# Patient Record
Sex: Female | Born: 1970 | Race: Black or African American | Hispanic: No | Marital: Married | Smoking: Never smoker
Health system: Southern US, Community
[De-identification: ages and names within clinical notes are randomized; demographics above are authoritative.]

## PROBLEM LIST (undated history)

## (undated) ENCOUNTER — Inpatient Hospital Stay (HOSPITAL_COMMUNITY): Payer: Self-pay

## (undated) DIAGNOSIS — J0100 Acute maxillary sinusitis, unspecified: Principal | ICD-10-CM

## (undated) DIAGNOSIS — N970 Female infertility associated with anovulation: Secondary | ICD-10-CM

## (undated) DIAGNOSIS — R221 Localized swelling, mass and lump, neck: Secondary | ICD-10-CM

## (undated) DIAGNOSIS — M549 Dorsalgia, unspecified: Secondary | ICD-10-CM

## (undated) DIAGNOSIS — O09529 Supervision of elderly multigravida, unspecified trimester: Secondary | ICD-10-CM

## (undated) DIAGNOSIS — E611 Iron deficiency: Secondary | ICD-10-CM

## (undated) DIAGNOSIS — E663 Overweight: Secondary | ICD-10-CM

## (undated) DIAGNOSIS — E785 Hyperlipidemia, unspecified: Secondary | ICD-10-CM

## (undated) DIAGNOSIS — O0900 Supervision of pregnancy with history of infertility, unspecified trimester: Secondary | ICD-10-CM

## (undated) DIAGNOSIS — R7303 Prediabetes: Secondary | ICD-10-CM

## (undated) DIAGNOSIS — B029 Zoster without complications: Secondary | ICD-10-CM

## (undated) DIAGNOSIS — R220 Localized swelling, mass and lump, head: Secondary | ICD-10-CM

## (undated) DIAGNOSIS — R42 Dizziness and giddiness: Secondary | ICD-10-CM

## (undated) DIAGNOSIS — R61 Generalized hyperhidrosis: Secondary | ICD-10-CM

## (undated) DIAGNOSIS — M419 Scoliosis, unspecified: Secondary | ICD-10-CM

## (undated) DIAGNOSIS — D649 Anemia, unspecified: Secondary | ICD-10-CM

## (undated) DIAGNOSIS — B019 Varicella without complication: Secondary | ICD-10-CM

## (undated) DIAGNOSIS — R0602 Shortness of breath: Secondary | ICD-10-CM

## (undated) HISTORY — DX: Supervision of pregnancy with history of infertility, unspecified trimester: O09.00

## (undated) HISTORY — DX: Localized swelling, mass and lump, neck: R22.1

## (undated) HISTORY — DX: Dizziness and giddiness: R42

## (undated) HISTORY — DX: Female infertility associated with anovulation: N97.0

## (undated) HISTORY — DX: Generalized hyperhidrosis: R61

## (undated) HISTORY — DX: Scoliosis, unspecified: M41.9

## (undated) HISTORY — DX: Anemia, unspecified: D64.9

## (undated) HISTORY — DX: Acute maxillary sinusitis, unspecified: J01.00

## (undated) HISTORY — DX: Iron deficiency: E61.1

## (undated) HISTORY — DX: Dorsalgia, unspecified: M54.9

## (undated) HISTORY — DX: Zoster without complications: B02.9

## (undated) HISTORY — DX: Localized swelling, mass and lump, head: R22.0

## (undated) HISTORY — DX: Supervision of elderly multigravida, unspecified trimester: O09.529

## (undated) HISTORY — PX: GANGLION CYST EXCISION: SHX1691

## (undated) HISTORY — DX: Overweight: E66.3

## (undated) HISTORY — DX: Hyperlipidemia, unspecified: E78.5

## (undated) HISTORY — DX: Varicella without complication: B01.9

## (undated) HISTORY — DX: Prediabetes: R73.03

## (undated) HISTORY — DX: Shortness of breath: R06.02

---

## 1995-01-17 HISTORY — PX: WRIST SURGERY: SHX841

## 2000-01-17 DIAGNOSIS — B029 Zoster without complications: Secondary | ICD-10-CM

## 2000-01-17 HISTORY — DX: Zoster without complications: B02.9

## 2003-07-10 ENCOUNTER — Emergency Department (HOSPITAL_COMMUNITY): Admission: EM | Admit: 2003-07-10 | Discharge: 2003-07-10 | Payer: Self-pay | Admitting: Emergency Medicine

## 2004-08-30 ENCOUNTER — Ambulatory Visit: Payer: Self-pay | Admitting: Internal Medicine

## 2004-09-20 ENCOUNTER — Ambulatory Visit: Payer: Self-pay | Admitting: Internal Medicine

## 2007-05-29 ENCOUNTER — Ambulatory Visit: Payer: Self-pay | Admitting: Internal Medicine

## 2007-05-29 DIAGNOSIS — R221 Localized swelling, mass and lump, neck: Secondary | ICD-10-CM

## 2007-05-29 DIAGNOSIS — R220 Localized swelling, mass and lump, head: Secondary | ICD-10-CM | POA: Insufficient documentation

## 2007-05-29 HISTORY — DX: Localized swelling, mass and lump, head: R22.0

## 2007-05-29 LAB — CONVERTED CEMR LAB
CO2: 29 meq/L (ref 19–32)
GFR calc Af Amer: 106 mL/min
Glucose, Bld: 96 mg/dL (ref 70–99)
Glucose, Urine, Semiquant: NEGATIVE
Lymphocytes Relative: 42 % (ref 12.0–46.0)
Monocytes Absolute: 0.3 10*3/uL (ref 0.1–1.0)
Monocytes Relative: 9.8 % (ref 3.0–12.0)
Neutrophils Relative %: 46.2 % (ref 43.0–77.0)
Platelets: 394 10*3/uL (ref 150–400)
Potassium: 4 meq/L (ref 3.5–5.1)
RDW: 12 % (ref 11.5–14.6)
Sodium: 141 meq/L (ref 135–145)
Specific Gravity, Urine: 1.02
T3 Uptake Ratio: 43 % — ABNORMAL HIGH (ref 22.5–37.0)
TSH: 0.64 microintl units/mL (ref 0.35–5.50)
WBC Urine, dipstick: NEGATIVE
pH: 8

## 2007-05-30 ENCOUNTER — Ambulatory Visit: Payer: Self-pay | Admitting: Cardiology

## 2007-06-07 ENCOUNTER — Ambulatory Visit: Payer: Self-pay | Admitting: Internal Medicine

## 2008-02-03 ENCOUNTER — Telehealth: Payer: Self-pay | Admitting: Internal Medicine

## 2008-02-03 ENCOUNTER — Ambulatory Visit: Payer: Self-pay | Admitting: Internal Medicine

## 2008-02-03 DIAGNOSIS — J0100 Acute maxillary sinusitis, unspecified: Secondary | ICD-10-CM | POA: Insufficient documentation

## 2008-02-03 HISTORY — DX: Acute maxillary sinusitis, unspecified: J01.00

## 2009-04-12 ENCOUNTER — Ambulatory Visit: Payer: Self-pay | Admitting: Family Medicine

## 2009-04-12 DIAGNOSIS — M546 Pain in thoracic spine: Secondary | ICD-10-CM | POA: Insufficient documentation

## 2009-04-12 DIAGNOSIS — M549 Dorsalgia, unspecified: Secondary | ICD-10-CM

## 2009-04-12 HISTORY — DX: Dorsalgia, unspecified: M54.9

## 2010-02-06 ENCOUNTER — Encounter: Payer: Self-pay | Admitting: Internal Medicine
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
## 2010-03-07 ENCOUNTER — Telehealth: Payer: Self-pay | Admitting: Internal Medicine

## 2010-03-07 ENCOUNTER — Ambulatory Visit (INDEPENDENT_AMBULATORY_CARE_PROVIDER_SITE_OTHER): Payer: BC Managed Care – PPO | Admitting: Internal Medicine

## 2010-03-07 ENCOUNTER — Encounter: Payer: Self-pay | Admitting: Internal Medicine

## 2010-03-07 VITALS — BP 120/80 | HR 76 | Temp 99.2°F | Resp 14 | Ht 69.0 in | Wt 188.0 lb

## 2010-03-07 DIAGNOSIS — R519 Headache, unspecified: Secondary | ICD-10-CM

## 2010-03-07 DIAGNOSIS — J0100 Acute maxillary sinusitis, unspecified: Secondary | ICD-10-CM

## 2010-03-07 DIAGNOSIS — R51 Headache: Secondary | ICD-10-CM

## 2010-03-07 MED ORDER — BROMPHENIRAMINE-PSEUDOEPH 4-60 MG PO TABS: 1.0000 | ORAL_TABLET | Freq: Four times a day (QID) | ORAL | Status: AC | PRN

## 2010-03-07 MED ORDER — LEVOFLOXACIN 500 MG PO TABS: 500.0000 mg | ORAL_TABLET | Freq: Every day | ORAL | Status: AC

## 2010-03-07 NOTE — Progress Notes (Signed)
Subjective:    Patient ID: Raven Bauer, female    DOB: [birth year], 40 y.o.   MRN: [id number]  HPI   patient is a 40 year old African American female who presents for an acute complaint of a headache with sinus congestion sore throat and loss of her voice.  She states that she has been sick for over a week but for the past 48 hours she has lost her voice she denies any sore throat but complains of a low-grade fever chills chronic hacking cough scratchy throat and some difficulty swallowing. She has no history of any additional risk factors or exposures she does seem to get sinus infections in the winter which may correspond to her known history of allergic rhinitis.    Review of Systems  Constitutional: Negative for activity change, appetite change and fatigue.  HENT: Positive for congestion, rhinorrhea, trouble swallowing, voice change, postnasal drip and sinus pressure. Negative for ear pain and neck pain.   Eyes: Negative for redness and visual disturbance.  Respiratory: Positive for cough and chest tightness. Negative for wheezing.   Gastrointestinal: Negative for abdominal pain and abdominal distention.  Genitourinary: Negative for dysuria, frequency and menstrual problem.  Musculoskeletal: Negative for myalgias, joint swelling and arthralgias.  Skin: Negative for rash and wound.  Neurological: Negative for dizziness, weakness and headaches.  Hematological: Negative for adenopathy. Does not bruise/bleed easily.  Psychiatric/Behavioral: Negative for sleep disturbance and decreased concentration.   Past Medical History  Diagnosis Date  . Acute maxillary sinusitis 02/03/2008  . BACK PAIN, UPPER 04/12/2009  . SWELLING MASS OR LUMP IN HEAD AND NECK 05/29/2007   History reviewed. No pertinent past surgical history.  reports that she has never smoked. She does not have any smokeless tobacco history on file. She reports that she does not drink alcohol or use illicit drugs. family  history includes Heart disease in her father; Hyperlipidemia in her father; and Hypertension in her father.        Objective:   Physical Exam  Constitutional: She is oriented to person, place, and time. She appears well-developed and well-nourished. She appears distressed.  HENT:  Head: Normocephalic and atraumatic.  Right Ear: External ear normal.  Left Ear: External ear normal.  Mouth/Throat: Oropharyngeal exudate present.        Turbinates are inflamed bilaterally greater on the right than the left  Eyes: Conjunctivae and EOM are normal. Pupils are equal, round, and reactive to light.  Neck: Normal range of motion. Neck supple. No JVD present. No tracheal deviation present. No thyromegaly present.  Cardiovascular: Normal rate, regular rhythm, normal heart sounds and intact distal pulses.   No murmur heard. Pulmonary/Chest: Effort normal and breath sounds normal. She has no wheezes. She exhibits no tenderness.  Abdominal: Soft. Bowel sounds are normal.  Musculoskeletal: Normal range of motion. She exhibits no edema and no tenderness.  Lymphadenopathy:    She has no cervical adenopathy.  Neurological: She is alert and oriented to person, place, and time. She has normal reflexes. No cranial nerve deficit.  Skin: Skin is warm and dry. She is not diaphoretic.  Psychiatric: She has a normal mood and affect. Her behavior is normal.          Assessment & Plan:   the patient  4 acute frontal sinusitis with headache in the past her sinusitis has been difficult to treat with complications she states that her symptoms are moderately severe and she is in moderate distress.   We will begin by treating  her with an antibiotic Levaquin 500 mg by mouth daily for 10 days should her symptoms not resolve a CT scan should be ordered of her sinuses to evaluate she'll be given Bromfed 4 times a day for congestion and she can continue the Delsym for her cough consideration for referral to an ear nose  and throat doctor should her symptoms persist as well as the aforementioned CT scan of the sinuses. And consideration can be given for allergy testing because of  the recurrent seasonal nature of these infections

## 2010-03-07 NOTE — Telephone Encounter (Signed)
Left message on machine for pt 

## 2010-03-07 NOTE — Telephone Encounter (Signed)
Would obtain  Mucinex fast max cold and sinus and use it as directed

## 2010-03-07 NOTE — Telephone Encounter (Signed)
Per phar brofed is not longer available. Please advise

## 2010-04-25 DIAGNOSIS — B019 Varicella without complication: Secondary | ICD-10-CM

## 2010-04-25 DIAGNOSIS — M419 Scoliosis, unspecified: Secondary | ICD-10-CM

## 2010-04-25 DIAGNOSIS — E611 Iron deficiency: Secondary | ICD-10-CM

## 2010-04-25 DIAGNOSIS — R61 Generalized hyperhidrosis: Secondary | ICD-10-CM

## 2010-04-25 HISTORY — DX: Generalized hyperhidrosis: R61

## 2010-04-25 HISTORY — PX: WISDOM TOOTH EXTRACTION: SHX21

## 2010-04-25 HISTORY — DX: Scoliosis, unspecified: M41.9

## 2010-04-25 HISTORY — DX: Iron deficiency: E61.1

## 2010-04-25 HISTORY — DX: Varicella without complication: B01.9

## 2011-03-28 DIAGNOSIS — R61 Generalized hyperhidrosis: Secondary | ICD-10-CM | POA: Insufficient documentation

## 2011-04-26 ENCOUNTER — Ambulatory Visit (INDEPENDENT_AMBULATORY_CARE_PROVIDER_SITE_OTHER): Payer: BC Managed Care – PPO | Admitting: Obstetrics and Gynecology

## 2011-04-26 ENCOUNTER — Encounter: Payer: Self-pay | Admitting: Obstetrics and Gynecology

## 2011-04-26 VITALS — BP 108/68 | HR 84 | Temp 98.3°F | Resp 16 | Ht 69.0 in | Wt 188.0 lb

## 2011-04-26 DIAGNOSIS — Z124 Encounter for screening for malignant neoplasm of cervix: Secondary | ICD-10-CM

## 2011-04-26 DIAGNOSIS — N979 Female infertility, unspecified: Secondary | ICD-10-CM

## 2011-04-26 NOTE — Progress Notes (Signed)
Subjective:    Raven Bauer is a 41 y.o. female, No obstetric history on file., who presents for an annual exam. The patient reports:normal menses, no abnormal bleeding, pelvic pain or discharge, no complaints    History  Sexual Activity  . Sexually Active: Yes  . .no method, trying to conceive.The patient reports AALCHX@ and  History  Smoking status  . Never Smoker   Smokeless tobacco  . Not on file  .  Last mammogram: not applicable  Last pap: was normal and not applicable  2012  GC/Chlamydia cultures offered: requested HIV/RPR/HbsAg offered:  declined HSV 1 and 2 glycoprotein offered: declined  Menstrual cycle:   LMP: Patient's last menstrual period was 04/05/2011.           Cycle is monthly with normal flow and without intermenstrual bleeding or severe dysmenorrhea   The following portions of the patient's history were reviewed and updated as appropriate: allergies, current medications, past medical history and problem list.  Review of Systems Pertinent items are noted in HPI. Breast:Negative for breast lump,nipple discharge or nipple retraction Gastrointestinal: negative, Negative for abdominal pain, change in bowel habits or rectal bleeding Urinary:negative      Objective:    BP 108/68  Pulse 84  Temp 98.3 F (36.8 C)  Resp 16  Ht 5\' 9"  (1.753 m)  Wt 188 lb (85.276 kg)  BMI 27.76 kg/m2  LMP 04/05/2011 Weight:  Wt Readings from Last 1 Encounters:  04/26/11 188 lb (85.276 kg)   BMI: Body mass index is 27.76 kg/(m^2). General Appearance: Alert, appropriate appearance for age. No acute distress HEENT: Grossly normal Neck / Thyroid: Supple, no masses, nodes or enlargement Lungs: clear to auscultation bilaterally Back: No CVA tenderness Breast Exam: No masses or nodes.No dimpling, nipple retraction or discharge. Cardiovascular: Regular rate and rhythm. S1, S2, no murmur Gastrointestinal: Soft, non-tender, no masses or organomegaly Pelvic Exam: External  genitalia: normal general appearance Vagina with white discharge.  Cervix no lesions.  Uterus nl sized Adnexa no masses Rectovaginal: not indicated and normal rectal, no masses Lymphatic Exam: Non-palpable nodes in neck, clavicular, axillary, or inguinal regions Skin: no rash or abnormalities Neurologic: Normal gait and speech, no tremor  Psychiatric: Alert and oriented, appropriate affect.   Wet Prep:positive hyphae Urinalysis:not applicable UPT: Not done   Assessment:    Monilia  Trying to conceive   Plan:    pap smear with HPV return annually or prn STD screening: see above Contraception:no method trying to conceive      Antha Niday PMD

## 2011-04-28 ENCOUNTER — Telehealth: Payer: Self-pay

## 2011-04-28 LAB — PAP IG, CT-NG NAA, HPV HIGH-RISK: Chlamydia Probe Amp: NEGATIVE

## 2011-04-28 NOTE — Telephone Encounter (Signed)
PC TO PT PER TELEPHONE NOTE. TOLD PT PAP SMEAR RESULTS NOT BACK YET; HOWEVER GC/CT- BOTH NEG. PT TOLD PROGESTERONE=NON OVULATORY(8.4). WILL CONSULT WITH [facility] PER POC RGDG PROGESTERONE AND WILL CB WITH RECS. PT AGREES.

## 2011-05-01 ENCOUNTER — Other Ambulatory Visit: Payer: BC Managed Care – PPO

## 2011-05-01 DIAGNOSIS — N979 Female infertility, unspecified: Secondary | ICD-10-CM

## 2011-05-01 NOTE — Telephone Encounter (Signed)
TC TO PT PER [facility] RECS. PT NEEDS TSH AND PROLACTIN LABS. APPT SCHED TODAY WITH LAB ONLY. PT AGREES.

## 2011-05-02 LAB — PROLACTIN: Prolactin: 6.3 ng/mL

## 2011-05-04 ENCOUNTER — Telehealth: Payer: Self-pay | Admitting: Obstetrics and Gynecology

## 2011-05-05 ENCOUNTER — Telehealth: Payer: Self-pay | Admitting: Obstetrics and Gynecology

## 2011-05-05 NOTE — Telephone Encounter (Signed)
Routed to triage 

## 2011-05-05 NOTE — Telephone Encounter (Signed)
Lm on pt's vm to cb rgd msg. bt cma

## 2011-05-08 NOTE — Telephone Encounter (Signed)
Spoke with pt rgd test results. Told pt the results have not came in yet would call pt back with results. bt cma

## 2011-05-10 NOTE — Telephone Encounter (Signed)
PC TO PT PER [facility] RECS. PT TOLD PROLACTIN AND TSH-WNL. PT NEEDS FU TO DISCUSS FERTILITY MEDS.  APPT SCHED 05/11/11@4 :00P WITH [facility]. PT AGREES.

## 2011-05-10 NOTE — Telephone Encounter (Signed)
PC TO PT PER TELEPHONE CALL. APOLOGIZED TO PT FOR LACK OF FU RGDG RESULTS. PT VOICES UNDERSTANDING. TOLD PT PAP SMEAR-WNL. HPV,GC/CT-ALL NEG. PROGESTERONE-8.4=NON OVULATORY. TOLD PT WILL FU WITH [facility] TODAY RGDG PROLACTIN AND TSH RESULTS ALSO. WILL CB WITH RECS TODAY. PT AGREES.

## 2011-05-10 NOTE — Telephone Encounter (Signed)
Raven Bauer has chart/chandra addressing issue

## 2011-05-11 ENCOUNTER — Telehealth: Payer: Self-pay

## 2011-05-11 ENCOUNTER — Ambulatory Visit (INDEPENDENT_AMBULATORY_CARE_PROVIDER_SITE_OTHER): Payer: BC Managed Care – PPO | Admitting: Obstetrics and Gynecology

## 2011-05-11 ENCOUNTER — Encounter: Payer: Self-pay | Admitting: Obstetrics and Gynecology

## 2011-05-11 VITALS — BP 122/78 | Temp 98.3°F | Ht 69.0 in

## 2011-05-11 DIAGNOSIS — N970 Female infertility associated with anovulation: Secondary | ICD-10-CM

## 2011-05-11 MED ORDER — INOSITOL-FOLIC ACID 2000-200 MG-MCG PO PACK: 1.0000 | PACK | Freq: Every day | ORAL | Status: DC

## 2011-05-11 MED ORDER — CLOMIPHENE CITRATE 50 MG PO TABS: 50.0000 mg | ORAL_TABLET | Freq: Every day | ORAL | Status: AC

## 2011-05-11 MED ORDER — PRENATAL VIT W/ FE BISG-FA 25-1 MG PO TABS: 1.0000 | ORAL_TABLET | Freq: Every day | ORAL | Status: DC

## 2011-05-11 NOTE — Telephone Encounter (Signed)
PT CALL HAS ALREADY BEEN TAKEN CARE OF BY CHANDRA

## 2011-05-11 NOTE — Patient Instructions (Signed)
Patient Education Materials to be provided at check out (*indicates is located in Garment/textile technologist):  Clomid

## 2011-05-11 NOTE — Progress Notes (Signed)
Subjective:    Raven Bauer is a 41 y.o. female No obstetric history on file. who presents with a diagnosis of  secondary infertility with evidence of anovulation for result review and followup.  Current medication: none  Objective:   Infertility laboratory results have been updated and/or reviewed:  and are remarkable for:day #21 progesterone of 8.4, nl TSH and prolactin, neg GC and CHL    BP 122/78  Temp 98.3 F (36.8 C)  Ht 5\' 9"  (1.753 m)  LMP 04/29/2011  Wt Readings from Last 1 Encounters:  04/26/11 188 lb (85.276 kg)    BMI: There is no weight on file to calculate BMI.  Pelvic exam:   Normal external genitalia    Normal uterus    Normal adnexa   Assessment:   1) Secondary infertility  2) Oligoovulation 3) Partner who has fathered no pregnancies  Plan:   1) Clomid 50 mg 2) Day 21 Progesterone 3)Timed intercourse 4) CSA for husband 5) Follow-up:  Day 28-32 6) PNV 7) Pregnitude   Jo Booze PMD 4/25/20137:03 PM

## 2011-06-20 ENCOUNTER — Encounter: Payer: BC Managed Care – PPO | Admitting: Obstetrics and Gynecology

## 2011-09-28 ENCOUNTER — Telehealth: Payer: Self-pay

## 2011-09-28 NOTE — Telephone Encounter (Signed)
Tc to pt per e-mail received rgdg medication list recieved from last OV. Inositol listed on sheet;however pt did not receive rx for it. Informed pt this is an OTC medication. Pt voices understanding. Pt states,"will start Clomid in October". Re informed pt of instructions to take Clomid 50mg  day 3-7 of cycle, timed IC every other day b/t day 11-17,  day 21 progesterone, and follow up with Dr. Pennie Rushing day 28-32 of cycle. Pt to call 1st day of menses to sched accordingly. Pt voices understanding.

## 2011-10-18 ENCOUNTER — Telehealth: Payer: Self-pay | Admitting: Obstetrics and Gynecology

## 2011-10-18 NOTE — Telephone Encounter (Signed)
msg for you

## 2011-10-18 NOTE — Telephone Encounter (Signed)
Tc to pt per telephone call. Appt sched 11/07/11 @ 4:00p with lab only for day 21 progesterone and fu appt sched 11/13/11 @ 4:30p with [facility]. Pt voices understanding.

## 2011-11-07 ENCOUNTER — Other Ambulatory Visit: Payer: BC Managed Care – PPO

## 2011-11-07 DIAGNOSIS — N970 Female infertility associated with anovulation: Secondary | ICD-10-CM

## 2011-11-13 ENCOUNTER — Encounter: Payer: Self-pay | Admitting: Obstetrics and Gynecology

## 2011-11-13 ENCOUNTER — Ambulatory Visit (INDEPENDENT_AMBULATORY_CARE_PROVIDER_SITE_OTHER): Payer: BC Managed Care – PPO | Admitting: Obstetrics and Gynecology

## 2011-11-13 VITALS — BP 110/64 | Ht 69.0 in | Wt 191.0 lb

## 2011-11-13 DIAGNOSIS — N898 Other specified noninflammatory disorders of vagina: Secondary | ICD-10-CM

## 2011-11-13 DIAGNOSIS — N970 Female infertility associated with anovulation: Secondary | ICD-10-CM

## 2011-11-13 LAB — POCT URINE PREGNANCY: Preg Test, Ur: NEGATIVE

## 2011-11-13 MED ORDER — CLOMIPHENE CITRATE 50 MG PO TABS: 50.0000 mg | ORAL_TABLET | Freq: Every day | ORAL | Status: DC

## 2011-11-13 NOTE — Progress Notes (Signed)
INFERTILITY FOLLOW UP:   Subjective:    Raven Bauer is a 41 y.o. female [id number] who presents with a diagnosis of  secondary infertility for ovary check.  Last Pap: 04/26/2011 WNL Trying to Conceive: yes G,P: 3,1 TSH: 0.868 05/01/2011 FSH/LH: n/a PRL: 6.3 05/01/2011 DAY 21 PROG: 23.0 11/07/2011 HSG: n/a Semen Analysis: n/a LSC: n/a FBS/INSULIN: n/a GC/CHLAMYDIA: Neg 04/26/2011   Current medication:Clomid 50 mg daily days 3 through 7  Objective:   Infertility laboratory results have been updated and/or reviewed:  yes    BP 110/64  Ht 5\' 9"  (1.753 m)  Wt 191 lb (86.637 kg)  BMI 28.21 kg/m2  LMP 10/18/2011  Wt Readings from Last 1 Encounters:  11/13/11 191 lb (86.637 kg)    BMI: Body mass index is 28.21 kg/(m^2).  Pelvic exam:   Normal external genitalia    Normal uterus    Normal adnexa   Assessment:  Secondary infertility Ovulation on Clomid  Plan:   Continue Clomid 50 mg Follow-up:  After 3 cycles of Clomid.  The patient may choose to restart Clomid in January after the holiday.   Dierdre Forth MD

## 2011-11-15 LAB — GC/CHLAMYDIA PROBE AMP
CT Probe RNA: NEGATIVE
GC Probe RNA: NEGATIVE

## 2012-01-17 NOTE — L&D Delivery Note (Signed)
Delivery Note At 6:27 AM a viable female was delivered via Vaginal, Spontaneous Delivery (Presentation:LOA ).  APGAR: 9, 9; weight pending.   Placenta status: Intact, Spontaneous.  Cord:  with the following complications: none.  Cord pH: not collected  Anesthesia: Epidural  Episiotomy:  Lacerations: 1st degree vaginal Suture Repair: 3.0 vicryl Est. Blood Loss (mL): 300 mL  Mom to postpartum.  Baby to skin to skin immediately after delivery.  Admit to AICU for 24 hours of Mag Sulfate Planned BTL Breastfeeding Inpatient circ   Haroldine Laws 10/12/2012, 7:06 AM

## 2012-02-22 ENCOUNTER — Other Ambulatory Visit: Payer: BC Managed Care – PPO

## 2012-02-22 DIAGNOSIS — N979 Female infertility, unspecified: Secondary | ICD-10-CM

## 2012-02-23 LAB — PROGESTERONE: Progesterone: 13.2 ng/mL

## 2012-03-02 ENCOUNTER — Other Ambulatory Visit: Payer: Self-pay

## 2012-03-04 ENCOUNTER — Other Ambulatory Visit: Payer: Self-pay

## 2012-03-04 ENCOUNTER — Other Ambulatory Visit: Payer: BC Managed Care – PPO

## 2012-03-04 ENCOUNTER — Encounter: Payer: BC Managed Care – PPO | Admitting: Obstetrics and Gynecology

## 2012-03-04 DIAGNOSIS — N979 Female infertility, unspecified: Secondary | ICD-10-CM

## 2012-03-04 LAB — HCG, QUANTITATIVE, PREGNANCY: hCG, Beta Chain, Quant, S: 539.4 m[IU]/mL

## 2012-03-06 ENCOUNTER — Telehealth: Payer: Self-pay | Admitting: Obstetrics and Gynecology

## 2012-03-06 ENCOUNTER — Other Ambulatory Visit: Payer: BC Managed Care – PPO

## 2012-03-06 DIAGNOSIS — N979 Female infertility, unspecified: Secondary | ICD-10-CM

## 2012-03-06 LAB — HCG, QUANTITATIVE, PREGNANCY: hCG, Beta Chain, Quant, S: 1407 m[IU]/mL

## 2012-03-06 NOTE — Telephone Encounter (Signed)
[facility] pt 

## 2012-03-15 ENCOUNTER — Ambulatory Visit: Payer: BC Managed Care – PPO | Admitting: Obstetrics and Gynecology

## 2012-03-15 DIAGNOSIS — Z331 Pregnant state, incidental: Secondary | ICD-10-CM

## 2012-03-18 LAB — PRENATAL PANEL VII
Antibody Screen: NEGATIVE
Basophils Absolute: 0 10*3/uL (ref 0.0–0.1)
Basophils Relative: 0 % (ref 0–1)
Eosinophils Relative: 1 % (ref 0–5)
HCT: 34.8 % — ABNORMAL LOW (ref 36.0–46.0)
HIV: NONREACTIVE
MCHC: 33.6 g/dL (ref 30.0–36.0)
Monocytes Absolute: 0.6 10*3/uL (ref 0.1–1.0)
Neutro Abs: 3.5 10*3/uL (ref 1.7–7.7)
Platelets: 412 10*3/uL — ABNORMAL HIGH (ref 150–400)
RDW: 13.2 % (ref 11.5–15.5)
Rh Type: POSITIVE
WBC: 6.2 10*3/uL (ref 4.0–10.5)

## 2012-03-19 LAB — CULTURE, OB URINE: Colony Count: 70000

## 2012-03-19 LAB — HEMOGLOBINOPATHY EVALUATION
Hgb A2 Quant: 2.6 % (ref 2.2–3.2)
Hgb F Quant: 0 % (ref 0.0–2.0)
Hgb S Quant: 0 %

## 2012-03-20 LAB — POCT URINALYSIS DIPSTICK
Blood, UA: NEGATIVE
Glucose, UA: NEGATIVE
Nitrite, UA: NEGATIVE
Protein, UA: NEGATIVE
Urobilinogen, UA: NEGATIVE
pH, UA: 6

## 2012-03-20 NOTE — Progress Notes (Unsigned)
NOB interview completed.  PNV samples given.  NOB work up scheduled for Monday 04/01/12 @ 0900 w/ [facility].

## 2012-04-01 ENCOUNTER — Ambulatory Visit: Payer: BC Managed Care – PPO

## 2012-04-01 VITALS — BP 110/64 | Wt 196.0 lb

## 2012-04-01 DIAGNOSIS — Z8759 Personal history of other complications of pregnancy, childbirth and the puerperium: Secondary | ICD-10-CM | POA: Insufficient documentation

## 2012-04-01 DIAGNOSIS — O0901 Supervision of pregnancy with history of infertility, first trimester: Secondary | ICD-10-CM

## 2012-04-01 DIAGNOSIS — IMO0002 Reserved for concepts with insufficient information to code with codable children: Secondary | ICD-10-CM

## 2012-04-01 DIAGNOSIS — O0900 Supervision of pregnancy with history of infertility, unspecified trimester: Secondary | ICD-10-CM | POA: Insufficient documentation

## 2012-04-01 DIAGNOSIS — O09529 Supervision of elderly multigravida, unspecified trimester: Secondary | ICD-10-CM | POA: Insufficient documentation

## 2012-04-01 DIAGNOSIS — E663 Overweight: Secondary | ICD-10-CM

## 2012-04-01 DIAGNOSIS — Z862 Personal history of diseases of the blood and blood-forming organs and certain disorders involving the immune mechanism: Secondary | ICD-10-CM | POA: Insufficient documentation

## 2012-04-01 HISTORY — DX: Supervision of pregnancy with history of infertility, unspecified trimester: O09.00

## 2012-04-01 HISTORY — DX: Overweight: E66.3

## 2012-04-01 HISTORY — DX: Supervision of elderly multigravida, unspecified trimester: O09.529

## 2012-04-01 LAB — US OB TRANSVAGINAL

## 2012-04-01 NOTE — Progress Notes (Signed)
[redacted]w[redacted]d Pt c/o indigestion and nausea Last pap 04/26/11 WNL GC/CT neg 03/15/12 via urine  Pt wants genetic screenings Pt declines flu vaccine  Ultrasound:  Amnion & yolk sac seen, S=D          Retroflexed uterus. WNL's          cx closed                      RTOV: Corpus luteum          LTOV: WNL's          Normal adnexa          Small amount of CDS fluid - WNL's

## 2012-04-01 NOTE — Progress Notes (Addendum)
  Subjective:    Raven Bauer is a 42y.o. MF, being seen today for her first obstetrical visit.  This is a planned pregnancy. She is at [redacted]w[redacted]d gestation. Her obstetrical history is significant for advanced maternal age and conceived on Clomid. Relationship with FOB: spouse, living together; pt has been remarried for 2 yrs. She has a 14y.o. Daughter from previous marriage.  Infertility for 1.5 years.  Tried clomid in the fall for one month, then took a break and retried in January, and conceived.  Pt works in Production manager support; husband works in Immunologist for a company that supplies [employer].  Patient does intend to breast feed. Pregnancy history fully reviewed.  Patient reports heartburn and nausea.  Review of Systems:   Review of Systems  Constitutional: Negative.   HENT: Negative.   Eyes: Negative.   Respiratory: Negative.   Cardiovascular: Negative.   Gastrointestinal: Positive for nausea.  Endocrine: Negative.   Genitourinary: Negative.   Musculoskeletal: Negative.   Neurological: Negative.     Objective:     BP 110/64  Wt 196 lb (88.905 kg)  BMI 28.93 kg/m2  LMP 02/02/2012 Physical Exam  Constitutional: She is oriented to person, place, and time. She appears well-developed and well-nourished. No distress.  HENT:  Head: Normocephalic and atraumatic.  Hair thin on top  Eyes: Pupils are equal, round, and reactive to light.  Neck:  Slightly enlarged thyroid  Cardiovascular: Normal rate and regular rhythm.   Respiratory: Effort normal and breath sounds normal.  GI: Soft.  No CVAT  Genitourinary:  Pelvic exam deferred  Musculoskeletal: She exhibits no edema.  Neurological: She is alert and oriented to person, place, and time.  Skin: Skin is warm and dry.  Psychiatric: She has a normal mood and affect. Her behavior is normal. Judgment and thought content normal.    Maternal Exam:  Introitus: not evaluated.   Cervix: not evaluated.   Fetal  Exam Fetal Monitor Review: Mode: ultrasound.   Baseline rate: 175.         Assessment:    Pregnancy: [id number] AMA Clomid pregnancy     Plan:  [redacted]w[redacted]d SIUP w/ size=dates on u/s today. Rev'd comfort measures for nausea; declines Rx at this time. Pap due in April, but pt may defer til July during birth month since no h/o abnl.     Initial labs drawn, WNL and rev'd. Prenatal vitamins. Problem list reviewed and updated.  Pt plans Harmony or [id number]; will check w/ her insurance to see which one is covered. Role of ultrasound in pregnancy discussed; fetal survey: u/s done today secondary to clomid pregnancy and twins run in her family; plan anatomy for next scan.. Amniocentesis discussed: declined. Follow up in 4 weeks, or prn. Marland Kitchen    Antonietta Breach 04/01/2012

## 2012-04-02 ENCOUNTER — Encounter: Payer: Self-pay | Admitting: Obstetrics and Gynecology

## 2012-04-11 ENCOUNTER — Encounter: Payer: Self-pay | Admitting: Obstetrics and Gynecology

## 2012-04-19 ENCOUNTER — Encounter: Payer: Self-pay | Admitting: Obstetrics and Gynecology

## 2012-09-06 ENCOUNTER — Inpatient Hospital Stay (HOSPITAL_COMMUNITY)
Admission: AD | Admit: 2012-09-06 | Discharge: 2012-09-06 | Disposition: A | Discharge status: Home / Self Care | Payer: BC Managed Care – PPO | Source: Ambulatory Visit | Attending: Obstetrics and Gynecology | Admitting: Obstetrics and Gynecology

## 2012-09-06 ENCOUNTER — Encounter (HOSPITAL_COMMUNITY): Payer: Self-pay | Admitting: *Deleted

## 2012-09-06 DIAGNOSIS — O09523 Supervision of elderly multigravida, third trimester: Secondary | ICD-10-CM

## 2012-09-06 DIAGNOSIS — O99891 Other specified diseases and conditions complicating pregnancy: Secondary | ICD-10-CM | POA: Insufficient documentation

## 2012-09-06 DIAGNOSIS — IMO0002 Reserved for concepts with insufficient information to code with codable children: Secondary | ICD-10-CM | POA: Insufficient documentation

## 2012-09-06 DIAGNOSIS — O0900 Supervision of pregnancy with history of infertility, unspecified trimester: Secondary | ICD-10-CM

## 2012-09-06 DIAGNOSIS — R51 Headache: Secondary | ICD-10-CM | POA: Insufficient documentation

## 2012-09-06 LAB — CBC
HCT: 32.1 % — ABNORMAL LOW (ref 36.0–46.0)
Hemoglobin: 11.3 g/dL — ABNORMAL LOW (ref 12.0–15.0)
MCH: 32.7 pg (ref 26.0–34.0)
MCHC: 35.2 g/dL (ref 30.0–36.0)
RDW: 13.2 % (ref 11.5–15.5)

## 2012-09-06 LAB — COMPREHENSIVE METABOLIC PANEL
Albumin: 2.8 g/dL — ABNORMAL LOW (ref 3.5–5.2)
BUN: 5 mg/dL — ABNORMAL LOW (ref 6–23)
Calcium: 8.8 mg/dL (ref 8.4–10.5)
GFR calc Af Amer: >90 mL/min (ref >90)
Glucose, Bld: 81 mg/dL (ref 70–99)
Potassium: 4 mEq/L (ref 3.5–5.1)
Sodium: 132 mEq/L — ABNORMAL LOW (ref 135–145)
Total Protein: 6.2 g/dL (ref 6.0–8.3)

## 2012-09-06 LAB — URIC ACID: Uric Acid, Serum: 4.3 mg/dL (ref 2.4–7.0)

## 2012-09-06 LAB — URINALYSIS, ROUTINE W REFLEX MICROSCOPIC
Ketones, ur: NEGATIVE mg/dL
Leukocytes, UA: NEGATIVE
Protein, ur: NEGATIVE mg/dL
Urobilinogen, UA: 0.2 mg/dL (ref 0.0–1.0)

## 2012-09-06 LAB — PROTEIN / CREATININE RATIO, URINE
Creatinine, Urine: 49.81 mg/dL
Protein Creatinine Ratio: 0.12 (ref 0.00–0.15)
Total Protein, Urine: 5.8 mg/dL

## 2012-09-06 LAB — LACTATE DEHYDROGENASE: LDH: 228 U/L (ref 94–250)

## 2012-09-06 NOTE — MAU Note (Signed)
Pt reports she has had lower extremity swelling. Reports increased pain in the right with swelling. Pt has tried elevating extremitries. Pt reports intermittent HA. No HA at this time.
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
## 2012-10-11 ENCOUNTER — Inpatient Hospital Stay (HOSPITAL_COMMUNITY)
Admission: AD | Admit: 2012-10-11 | Discharge: 2012-10-14 | DRG: 652 | Disposition: A | Discharge status: Home / Self Care | Payer: BC Managed Care – PPO | Source: Ambulatory Visit | Attending: Obstetrics and Gynecology | Admitting: Obstetrics and Gynecology

## 2012-10-11 ENCOUNTER — Encounter (HOSPITAL_COMMUNITY): Payer: Self-pay | Admitting: *Deleted

## 2012-10-11 DIAGNOSIS — R748 Abnormal levels of other serum enzymes: Secondary | ICD-10-CM | POA: Diagnosis present

## 2012-10-11 DIAGNOSIS — O09529 Supervision of elderly multigravida, unspecified trimester: Secondary | ICD-10-CM | POA: Diagnosis present

## 2012-10-11 DIAGNOSIS — O1424 HELLP syndrome, complicating childbirth: Secondary | ICD-10-CM | POA: Diagnosis present

## 2012-10-11 DIAGNOSIS — D62 Acute posthemorrhagic anemia: Secondary | ICD-10-CM | POA: Diagnosis not present

## 2012-10-11 DIAGNOSIS — IMO0002 Reserved for concepts with insufficient information to code with codable children: Principal | ICD-10-CM | POA: Diagnosis present

## 2012-10-11 DIAGNOSIS — D689 Coagulation defect, unspecified: Secondary | ICD-10-CM | POA: Diagnosis present

## 2012-10-11 DIAGNOSIS — E669 Obesity, unspecified: Secondary | ICD-10-CM | POA: Diagnosis present

## 2012-10-11 DIAGNOSIS — D696 Thrombocytopenia, unspecified: Secondary | ICD-10-CM | POA: Diagnosis present

## 2012-10-11 DIAGNOSIS — Z302 Encounter for sterilization: Secondary | ICD-10-CM

## 2012-10-11 LAB — OB RESULTS CONSOLE GBS: GBS: NEGATIVE

## 2012-10-11 LAB — GROUP B STREP BY PCR: Group B strep by PCR: NEGATIVE

## 2012-10-11 LAB — TYPE AND SCREEN

## 2012-10-11 LAB — CBC
Hemoglobin: 12.6 g/dL (ref 12.0–15.0)
RBC: 3.91 MIL/uL (ref 3.87–5.11)
WBC: 6.9 10*3/uL (ref 4.0–10.5)

## 2012-10-11 MED ORDER — ACETAMINOPHEN 325 MG PO TABS: 650.0000 mg | ORAL_TABLET | ORAL | Status: DC | PRN

## 2012-10-11 MED ORDER — ONDANSETRON HCL 4 MG/2ML IJ SOLN
4.0000 mg | Freq: Four times a day (QID) | INTRAMUSCULAR | Status: DC | PRN
  Administered 2012-10-11 – 2012-10-12 (×2): 4 mg via INTRAVENOUS
  Filled 2012-10-11 (×2): qty 2

## 2012-10-11 MED ORDER — LIDOCAINE HCL (PF) 1 % IJ SOLN
30.0000 mL | INTRAMUSCULAR | Status: DC | PRN
  Filled 2012-10-11: qty 30

## 2012-10-11 MED ORDER — OXYCODONE-ACETAMINOPHEN 5-325 MG PO TABS: 1.0000 | ORAL_TABLET | ORAL | Status: DC | PRN

## 2012-10-11 MED ORDER — PANTOPRAZOLE SODIUM 40 MG PO TBEC
40.0000 mg | DELAYED_RELEASE_TABLET | Freq: Every day | ORAL | Status: DC
  Administered 2012-10-11: 40 mg via ORAL
  Filled 2012-10-11 (×2): qty 1

## 2012-10-11 MED ORDER — BUTORPHANOL TARTRATE 1 MG/ML IJ SOLN: 1.0000 mg | INTRAMUSCULAR | Status: DC | PRN

## 2012-10-11 MED ORDER — OXYTOCIN BOLUS FROM INFUSION: 500.0000 mL | INTRAVENOUS | Status: DC

## 2012-10-11 MED ORDER — CITRIC ACID-SODIUM CITRATE 334-500 MG/5ML PO SOLN
30.0000 mL | ORAL | Status: DC | PRN
  Administered 2012-10-11 – 2012-10-12 (×3): 30 mL via ORAL
  Filled 2012-10-11 (×4): qty 15

## 2012-10-11 MED ORDER — IBUPROFEN 600 MG PO TABS
600.0000 mg | ORAL_TABLET | Freq: Four times a day (QID) | ORAL | Status: DC | PRN
  Administered 2012-10-12: 600 mg via ORAL
  Filled 2012-10-11: qty 1

## 2012-10-11 MED ORDER — FLEET ENEMA 7-19 GM/118ML RE ENEM: 1.0000 | ENEMA | RECTAL | Status: DC | PRN

## 2012-10-11 MED ORDER — OXYTOCIN 40 UNITS IN LACTATED RINGERS INFUSION - SIMPLE MED: 62.5000 mL/h | INTRAVENOUS | Status: DC

## 2012-10-11 MED ORDER — MAGNESIUM SULFATE 40 G IN LACTATED RINGERS - SIMPLE
2.0000 g/h | INTRAVENOUS | Status: DC
  Administered 2012-10-11: 2 g/h via INTRAVENOUS
  Filled 2012-10-11: qty 500

## 2012-10-11 MED ORDER — MAGNESIUM SULFATE BOLUS VIA INFUSION
4.0000 g | Freq: Once | INTRAVENOUS | Status: AC
  Administered 2012-10-11: 4 g via INTRAVENOUS
  Filled 2012-10-11: qty 500

## 2012-10-11 MED ORDER — LACTATED RINGERS IV SOLN
INTRAVENOUS | Status: DC
  Administered 2012-10-11 – 2012-10-12 (×3): via INTRAVENOUS

## 2012-10-11 MED ORDER — LACTATED RINGERS IV SOLN: 500.0000 mL | INTRAVENOUS | Status: DC | PRN

## 2012-10-11 NOTE — Progress Notes (Signed)
Dr. Stefano Gaul on the unit and giving orders to transfer pt to L&D for IOL

## 2012-10-11 NOTE — Progress Notes (Signed)
42 y.o. year old female,at [redacted]w[redacted]d gestation.  SUBJECTIVE:  Doing okay  OBJECTIVE:  BP 146/100  Pulse 78  Temp(Src) 98.2 F (36.8 C) (Oral)  Resp 19  Ht 5\' 9"  (1.753 m)  Wt 208 lb (94.348 kg)  BMI 30.7 kg/m2  SpO2 99%  LMP 02/02/2012  Fetal Heart Tones:  Category 1  Contractions:          None  Cervix: 1-2 cm, 70% effaced, -2 station  Foley bulb inserted without difficulty.  ASSESSMENT:  [redacted]w[redacted]d Weeks Pregnancy  Preeclampsia  PLAN:  We will begin Pitocin once the Foley bulb has fallen out. When the patient is in active labor, we will rupture her membranes. Check mag level and laboratory test in the morning.  Leonard Schwartz, M.D.

## 2012-10-11 NOTE — Progress Notes (Signed)
MD informed that pt is in 158 and awaiting orders

## 2012-10-11 NOTE — Progress Notes (Signed)
Discussed pain relief options with patient and family. Patient is feeling more pain with contractions across lower abdomen, relaxing well with contractions. Frequently up to bathroom, voiding well but small amounts. Explained Foley bulb putting pressure on urethra and this may be causing her to feel the need to void more often. Patient very agreeable, student nurse at bedside with this RN.
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
## 2012-10-11 NOTE — Progress Notes (Signed)
Pt to 167 and report to Laqueta Jean

## 2012-10-11 NOTE — Progress Notes (Signed)
  Subjective: POC: Continue foley bulb until it comes out then start Pitocin.  Continue Mag Sulfate prophylaxis therapy. Pt sitting up in bed visiting with friends and family.  Pt having to breath through UCs.  Discussed POC, pt voices understanding and is agreeable.  Discussed pain management, pt understands those available and will request when she feels it is [place].  Objective: BP 161/97  Pulse 67  Temp(Src) 98.2 F (36.8 C) (Oral)  Resp 19  Ht 5\' 9"  (1.753 m)  Wt 208 lb (94.348 kg)  BMI 30.7 kg/m2  SpO2 99%  LMP 02/02/2012 I/O last 3 completed shifts: In: 192.9 [I.V.:192.9] Out: 80 [Urine:80] Total I/O In: -  Out: 125 [Urine:125]  FHT:  Cat I UC:   Occational  SVE:   Deferred at this time  Assessment / Plan:  Labor: IOL for pre-e; foley bulb in; will initiate pit after foley comes out, and AROM in active labor Preeclampsia: Mag sulfate therapy; 146/100 - 162/97 Fetal Wellbeing: Cat I Pain Control: Breathing and relaxation I/D: GBS neg; intact; afibrile Anticipated MOD: SVD   Raven Bauer 10/11/2012, 7:36 PM

## 2012-10-12 ENCOUNTER — Encounter (HOSPITAL_COMMUNITY): Payer: Self-pay | Admitting: Anesthesiology

## 2012-10-12 ENCOUNTER — Inpatient Hospital Stay (HOSPITAL_COMMUNITY): Payer: BC Managed Care – PPO | Admitting: Anesthesiology

## 2012-10-12 ENCOUNTER — Encounter (HOSPITAL_COMMUNITY): Payer: Self-pay | Admitting: *Deleted

## 2012-10-12 LAB — RPR: RPR Ser Ql: NONREACTIVE

## 2012-10-12 LAB — CBC WITH DIFFERENTIAL/PLATELET
Band Neutrophils: 0 % (ref 0–10)
Blasts: 0 %
HCT: 35.1 % — ABNORMAL LOW (ref 36.0–46.0)
Lymphocytes Relative: 30 % (ref 12–46)
Lymphs Abs: 2.9 10*3/uL (ref 0.7–4.0)
MCHC: 35.9 g/dL (ref 30.0–36.0)
MCV: 89.1 fL (ref 78.0–100.0)
Metamyelocytes Relative: 0 %
Neutro Abs: 5.5 10*3/uL (ref 1.7–7.7)
Neutrophils Relative %: 58 % (ref 43–77)
Promyelocytes Absolute: 0 %
RDW: 14 % (ref 11.5–15.5)

## 2012-10-12 LAB — COMPREHENSIVE METABOLIC PANEL
AST: 111 U/L — ABNORMAL HIGH (ref 0–37)
Albumin: 2.3 g/dL — ABNORMAL LOW (ref 3.5–5.2)
Chloride: 101 mEq/L (ref 96–112)
Creatinine, Ser: 0.84 mg/dL (ref 0.50–1.10)
GFR calc Af Amer: >90 mL/min (ref >90)
GFR calc non Af Amer: 86 mL/min — ABNORMAL LOW (ref >90)
Glucose, Bld: 99 mg/dL (ref 70–99)
Potassium: 4.1 mEq/L (ref 3.5–5.1)
Sodium: 135 mEq/L (ref 135–145)
Total Bilirubin: 0.5 mg/dL (ref 0.3–1.2)

## 2012-10-12 LAB — MRSA PCR SCREENING: MRSA by PCR: NEGATIVE

## 2012-10-12 LAB — ABO/RH: ABO/RH(D): O POS

## 2012-10-12 MED ORDER — PANTOPRAZOLE SODIUM 40 MG PO TBEC
40.0000 mg | DELAYED_RELEASE_TABLET | Freq: Every evening | ORAL | Status: DC
  Administered 2012-10-12: 40 mg via ORAL
  Filled 2012-10-12 (×3): qty 1

## 2012-10-12 MED ORDER — MAGNESIUM SULFATE 40 G IN LACTATED RINGERS - SIMPLE
2.0000 g/h | INTRAVENOUS | Status: DC
  Administered 2012-10-12: 2 g/h via INTRAVENOUS
  Filled 2012-10-12: qty 500

## 2012-10-12 MED ORDER — PHENYLEPHRINE 40 MCG/ML (10ML) SYRINGE FOR IV PUSH (FOR BLOOD PRESSURE SUPPORT)
80.0000 ug | PREFILLED_SYRINGE | INTRAVENOUS | Status: DC | PRN
  Filled 2012-10-12: qty 2

## 2012-10-12 MED ORDER — DIBUCAINE 1 % RE OINT: 1.0000 "application " | TOPICAL_OINTMENT | RECTAL | Status: DC | PRN

## 2012-10-12 MED ORDER — OXYTOCIN 40 UNITS IN LACTATED RINGERS INFUSION - SIMPLE MED
1.0000 m[IU]/min | INTRAVENOUS | Status: DC
  Administered 2012-10-12: 1 m[IU]/min via INTRAVENOUS
  Filled 2012-10-12: qty 1000

## 2012-10-12 MED ORDER — LIDOCAINE HCL (PF) 1 % IJ SOLN
INTRAMUSCULAR | Status: DC | PRN
  Administered 2012-10-12 (×2): 4 mL

## 2012-10-12 MED ORDER — LABETALOL HCL 5 MG/ML IV SOLN
10.0000 mg | INTRAVENOUS | Status: DC | PRN
  Administered 2012-10-12: 20 mg via INTRAVENOUS
  Filled 2012-10-12: qty 4

## 2012-10-12 MED ORDER — SENNOSIDES-DOCUSATE SODIUM 8.6-50 MG PO TABS
2.0000 | ORAL_TABLET | ORAL | Status: DC
  Administered 2012-10-13: 2 via ORAL

## 2012-10-12 MED ORDER — WITCH HAZEL-GLYCERIN EX PADS: 1.0000 "application " | MEDICATED_PAD | CUTANEOUS | Status: DC | PRN

## 2012-10-12 MED ORDER — LACTATED RINGERS IV SOLN
INTRAVENOUS | Status: DC
  Administered 2012-10-13: 1000 mL via INTRAVENOUS
  Administered 2012-10-13: via INTRAVENOUS

## 2012-10-12 MED ORDER — LANOLIN HYDROUS EX OINT: TOPICAL_OINTMENT | CUTANEOUS | Status: DC | PRN

## 2012-10-12 MED ORDER — DIPHENHYDRAMINE HCL 25 MG PO CAPS: 25.0000 mg | ORAL_CAPSULE | Freq: Four times a day (QID) | ORAL | Status: DC | PRN

## 2012-10-12 MED ORDER — EPHEDRINE 5 MG/ML INJ
10.0000 mg | INTRAVENOUS | Status: DC | PRN
  Filled 2012-10-12: qty 2
  Filled 2012-10-12: qty 4

## 2012-10-12 MED ORDER — ZOLPIDEM TARTRATE 5 MG PO TABS: 5.0000 mg | ORAL_TABLET | Freq: Every evening | ORAL | Status: DC | PRN

## 2012-10-12 MED ORDER — ACETAMINOPHEN 325 MG PO TABS
650.0000 mg | ORAL_TABLET | ORAL | Status: DC | PRN
  Administered 2012-10-12: 650 mg via ORAL
  Filled 2012-10-12: qty 2

## 2012-10-12 MED ORDER — FENTANYL 2.5 MCG/ML BUPIVACAINE 1/10 % EPIDURAL INFUSION (WH - ANES)
14.0000 mL/h | INTRAMUSCULAR | Status: DC | PRN
  Filled 2012-10-12: qty 125

## 2012-10-12 MED ORDER — ONDANSETRON HCL 4 MG/2ML IJ SOLN: 4.0000 mg | INTRAMUSCULAR | Status: DC | PRN

## 2012-10-12 MED ORDER — ONDANSETRON HCL 4 MG PO TABS: 4.0000 mg | ORAL_TABLET | ORAL | Status: DC | PRN

## 2012-10-12 MED ORDER — FENTANYL 2.5 MCG/ML BUPIVACAINE 1/10 % EPIDURAL INFUSION (WH - ANES)
INTRAMUSCULAR | Status: DC | PRN
  Administered 2012-10-12: 14 mL/h via EPIDURAL

## 2012-10-12 MED ORDER — TETANUS-DIPHTH-ACELL PERTUSSIS 5-2.5-18.5 LF-MCG/0.5 IM SUSP
0.5000 mL | Freq: Once | INTRAMUSCULAR | Status: DC
  Filled 2012-10-12: qty 0.5

## 2012-10-12 MED ORDER — DIPHENHYDRAMINE HCL 50 MG/ML IJ SOLN: 12.5000 mg | INTRAMUSCULAR | Status: DC | PRN

## 2012-10-12 MED ORDER — FERROUS SULFATE 325 (65 FE) MG PO TABS
325.0000 mg | ORAL_TABLET | Freq: Two times a day (BID) | ORAL | Status: DC
  Administered 2012-10-13 – 2012-10-14 (×2): 325 mg via ORAL
  Filled 2012-10-12 (×3): qty 1

## 2012-10-12 MED ORDER — PHENYLEPHRINE 40 MCG/ML (10ML) SYRINGE FOR IV PUSH (FOR BLOOD PRESSURE SUPPORT)
80.0000 ug | PREFILLED_SYRINGE | INTRAVENOUS | Status: DC | PRN
  Filled 2012-10-12: qty 2
  Filled 2012-10-12: qty 5

## 2012-10-12 MED ORDER — PRENATAL MULTIVITAMIN CH
1.0000 | ORAL_TABLET | Freq: Every day | ORAL | Status: DC
  Administered 2012-10-12: 1 via ORAL
  Filled 2012-10-12: qty 1

## 2012-10-12 MED ORDER — TERBUTALINE SULFATE 1 MG/ML IJ SOLN: 0.2500 mg | Freq: Once | INTRAMUSCULAR | Status: DC | PRN

## 2012-10-12 MED ORDER — IBUPROFEN 600 MG PO TABS
600.0000 mg | ORAL_TABLET | Freq: Four times a day (QID) | ORAL | Status: DC
  Administered 2012-10-12 – 2012-10-14 (×6): 600 mg via ORAL
  Filled 2012-10-12 (×6): qty 1

## 2012-10-12 MED ORDER — BENZOCAINE-MENTHOL 20-0.5 % EX AERO
1.0000 "application " | INHALATION_SPRAY | CUTANEOUS | Status: DC | PRN
  Administered 2012-10-12 – 2012-10-14 (×2): 1 via TOPICAL
  Filled 2012-10-12 (×2): qty 56

## 2012-10-12 MED ORDER — OXYCODONE-ACETAMINOPHEN 5-325 MG PO TABS
1.0000 | ORAL_TABLET | ORAL | Status: DC | PRN
  Administered 2012-10-13 – 2012-10-14 (×2): 1 via ORAL
  Filled 2012-10-12 (×4): qty 1

## 2012-10-12 MED ORDER — EPHEDRINE 5 MG/ML INJ
10.0000 mg | INTRAVENOUS | Status: DC | PRN
  Filled 2012-10-12: qty 2

## 2012-10-12 MED ORDER — SIMETHICONE 80 MG PO CHEW
80.0000 mg | CHEWABLE_TABLET | ORAL | Status: DC | PRN
  Administered 2012-10-13: 80 mg via ORAL

## 2012-10-12 MED ORDER — LACTATED RINGERS IV SOLN: 500.0000 mL | Freq: Once | INTRAVENOUS | Status: DC

## 2012-10-12 NOTE — Progress Notes (Signed)
Post Partum Day 0  Subjective: Doing well. Mild headache. No blurred vision or RUQ tenderness.  Objective: Blood pressure 164/91, pulse 65, temperature 98.1 F (36.7 C), temperature source Oral, resp. rate 18, height 5\' 9"  (1.753 m), weight 203 lb 14.4 oz (92.488 kg), last menstrual period 02/02/2012, SpO2 100.00%, unknown if currently breastfeeding.  Physical Exam:  General: no distress Lochia: appropriate Uterine Fundus: firm Incision: NA DVT Evaluation: No evidence of DVT seen on physical exam. Chest: Clear Heart: RRR Reflexes: WNL   Recent Labs  10/11/12 1720 10/12/12 0458  HGB 12.6 12.6  HCT 35.3* 35.1*   Results for orders placed during the hospital encounter of 10/11/12 (from the past 24 hour(s))  GROUP B STREP BY PCR     Status: None   Collection Time    10/11/12  5:15 PM      Result Value Range   Group B strep by PCR NEGATIVE  NEGATIVE  CBC     Status: Abnormal   Collection Time    10/11/12  5:20 PM      Result Value Range   WBC 6.9  4.0 - 10.5 K/uL   RBC 3.91  3.87 - 5.11 MIL/uL   Hemoglobin 12.6  12.0 - 15.0 g/dL   HCT 04.5 (*) 40.9 - 81.1 %   MCV 90.3  78.0 - 100.0 fL   MCH 32.2  26.0 - 34.0 pg   MCHC 35.7  30.0 - 36.0 g/dL   RDW 91.4  78.2 - 95.6 %   Platelets 132 (*) 150 - 400 K/uL  RPR     Status: None   Collection Time    10/11/12  5:20 PM      Result Value Range   RPR NON REACTIVE  NON REACTIVE  TYPE AND SCREEN     Status: None   Collection Time    10/11/12  5:20 PM      Result Value Range   ABO/RH(D) O POS     Antibody Screen NEG     Sample Expiration 10/14/2012    ABO/RH     Status: None   Collection Time    10/11/12  5:20 PM      Result Value Range   ABO/RH(D) O POS    CBC WITH DIFFERENTIAL     Status: Abnormal   Collection Time    10/12/12  4:58 AM      Result Value Range   WBC 9.6  4.0 - 10.5 K/uL   RBC 3.94  3.87 - 5.11 MIL/uL   Hemoglobin 12.6  12.0 - 15.0 g/dL   HCT 21.3 (*) 08.6 - 57.8 %   MCV 89.1  78.0 - 100.0 fL   MCH 32.0  26.0 - 34.0 pg   MCHC 35.9  30.0 - 36.0 g/dL   RDW 46.9  62.9 - 52.8 %   Platelets 124 (*) 150 - 400 K/uL   Neutrophils Relative % 58  43 - 77 %   Lymphocytes Relative 30  12 - 46 %   Monocytes Relative 12  3 - 12 %   Eosinophils Relative 0  0 - 5 %   Basophils Relative 0  0 - 1 %   Band Neutrophils 0  0 - 10 %   Metamyelocytes Relative 0     Myelocytes 0     Promyelocytes Absolute 0     Blasts 0     nRBC 1 (*) 0 /100 WBC   Neutro Abs 5.5  1.7 -  7.7 K/uL   Lymphs Abs 2.9  0.7 - 4.0 K/uL   Monocytes Absolute 1.2 (*) 0.1 - 1.0 K/uL   Eosinophils Absolute 0.0  0.0 - 0.7 K/uL   Basophils Absolute 0.0  0.0 - 0.1 K/uL  COMPREHENSIVE METABOLIC PANEL     Status: Abnormal   Collection Time    10/12/12  7:20 AM      Result Value Range   Sodium 135  135 - 145 mEq/L   Potassium 4.1  3.5 - 5.1 mEq/L   Chloride 101  96 - 112 mEq/L   CO2 24  19 - 32 mEq/L   Glucose, Bld 99  70 - 99 mg/dL   BUN 5 (*) 6 - 23 mg/dL   Creatinine, Ser 1.61  0.50 - 1.10 mg/dL   Calcium 7.9 (*) 8.4 - 10.5 mg/dL   Total Protein 5.7 (*) 6.0 - 8.3 g/dL   Albumin 2.3 (*) 3.5 - 5.2 g/dL   AST 096 (*) 0 - 37 U/L   ALT 85 (*) 0 - 35 U/L   Alkaline Phosphatase 155 (*) 39 - 117 U/L   Total Bilirubin 0.5  0.3 - 1.2 mg/dL   GFR calc non Af Amer 86 (*) >90 mL/min   GFR calc Af Amer >90  >90 mL/min  MAGNESIUM     Status: Abnormal   Collection Time    10/12/12  7:20 AM      Result Value Range   Magnesium 4.8 (*) 1.5 - 2.5 mg/dL    Assessment/Plan: Breastfeeding Wants PP BTL. R and B reviewed. Scheduled for 8:00 am tomorrow. NPO after midnight. Circ discussed.   LOS: 1 day   Lalisa Kiehn V 10/12/2012, 11:41 AM

## 2012-10-12 NOTE — Progress Notes (Signed)
  Subjective: Pt is resting, breathing through UCs.  Discussed initiating Pitocin, pt voiced understanding and is agreeable.  Objective: BP 158/96  Pulse 70  Temp(Src) 98.8 F (37.1 C) (Oral)  Resp 18  Ht 5\' 9"  (1.753 m)  Wt 208 lb (94.348 kg)  BMI 30.7 kg/m2  SpO2 99%  LMP 02/02/2012 I/O last 3 completed shifts: In: 192.9 [I.V.:192.9] Out: 80 [Urine:80] Total I/O In: 225 [P.O.:100; I.V.:125] Out: 625 [Urine:625]  FHT:  Cat I UC:   irregular, every 2-7 minutes  SVE:   3cm / 60% / -2 / anterior / soft Bishop score of 9  Assessment / Plan:  Labor: IOL for pre-e; foley bulb came out; initiating pitocin; AROM when active labor begins Preeclampsia: Mag sulfate therapy; 144/89 - 162/97; no s/s Fetal Wellbeing: Cat I  Pain Control: Breathing and relaxation  I/D: GBS neg; intact; afibrile  Anticipated MOD: SVD   Raven Bauer 10/12/2012, 12:16 AM

## 2012-10-12 NOTE — Anesthesia Postprocedure Evaluation (Signed)
  Anesthesia Post-op Note  Patient: Raven Bauer  Procedure(s) Performed: * No procedures listed *  Patient Location: ICU  Anesthesia Type:Epidural  Level of Consciousness: awake  Airway and Oxygen Therapy: Patient Spontanous Breathing  Post-op Pain: mild  Post-op Assessment: Patient's Cardiovascular Status Stable and Respiratory Function Stable  Post-op Vital Signs: stable  Complications: No apparent anesthesia complications

## 2012-10-12 NOTE — Anesthesia Preprocedure Evaluation (Signed)
Anesthesia Evaluation  Patient identified by MRN, date of birth, ID band Patient awake    Reviewed: Allergy & Precautions, H&P , NPO status , Patient's Chart, lab work & pertinent test results  Airway Mallampati: II TM Distance: >3 FB Neck ROM: Full    Dental   Pulmonary neg pulmonary ROS,  breath sounds clear to auscultation        Cardiovascular negative cardio ROS  Rhythm:Regular     Neuro/Psych  Headaches, negative psych ROS   GI/Hepatic negative GI ROS, Neg liver ROS,   Endo/Other  negative endocrine ROS  Renal/GU negative Renal ROS     Musculoskeletal negative musculoskeletal ROS (+)   Abdominal   Peds  Hematology negative hematology ROS (+)   Anesthesia Other Findings   Reproductive/Obstetrics (+) Pregnancy                           Anesthesia Physical Anesthesia Plan  ASA: II  Anesthesia Plan: Epidural   Post-op Pain Management:    Induction:   Airway Management Planned:   Additional Equipment:   Intra-op Plan:   Post-operative Plan:   Informed Consent: I have reviewed the patients History and Physical, chart, labs and discussed the procedure including the risks, benefits and alternatives for the proposed anesthesia with the patient or authorized representative who has indicated his/her understanding and acceptance.     Plan Discussed with:   Anesthesia Plan Comments:         Anesthesia Quick Evaluation

## 2012-10-12 NOTE — Progress Notes (Signed)
10/12/12 1530  Vitals  BP ! 162/97 mmHg  MAP (mmHg) 112  Pulse Rate 67  Oxygen Therapy  SpO2 100 %  10 mg Labetalol given IV

## 2012-10-12 NOTE — Anesthesia Procedure Notes (Signed)
Epidural  Start time: 10/12/2012 6:00 AM End time: 10/12/2012 6:15 AM  Staffing Anesthesiologist: Lewie Loron R Performed by: anesthesiologist   Preanesthetic Checklist Completed: patient identified, pre-op evaluation, timeout performed, IV checked, risks and benefits discussed and monitors and equipment checked  Epidural Patient position: sitting Prep: site prepped and draped and DuraPrep Patient monitoring: continuous pulse ox, blood pressure and heart rate Approach: midline Injection technique: LOR air  Needle:  Needle type: Tuohy  Needle gauge: 17 G Needle length: 9 cm Needle insertion depth: 5 cm Catheter type: closed end flexible Catheter size: 19 Gauge Catheter at skin depth: 11 cm Test dose: negative  Assessment Sensory level: T8 Events: blood not aspirated, injection not painful, no injection resistance, negative IV test and no paresthesia  Additional Notes Reason for block:procedure for pain

## 2012-10-13 ENCOUNTER — Encounter (HOSPITAL_COMMUNITY)
Admission: AD | Disposition: A | Discharge status: Home / Self Care | Payer: Self-pay | Source: Ambulatory Visit | Attending: Obstetrics and Gynecology

## 2012-10-13 ENCOUNTER — Encounter (HOSPITAL_COMMUNITY): Payer: Self-pay | Admitting: Anesthesiology

## 2012-10-13 ENCOUNTER — Inpatient Hospital Stay (HOSPITAL_COMMUNITY): Payer: BC Managed Care – PPO | Admitting: Anesthesiology

## 2012-10-13 HISTORY — PX: TUBAL LIGATION: SHX77

## 2012-10-13 LAB — COMPREHENSIVE METABOLIC PANEL
AST: 65 U/L — ABNORMAL HIGH (ref 0–37)
Albumin: 2 g/dL — ABNORMAL LOW (ref 3.5–5.2)
BUN: 5 mg/dL — ABNORMAL LOW (ref 6–23)
Chloride: 102 mEq/L (ref 96–112)
Creatinine, Ser: 0.81 mg/dL (ref 0.50–1.10)
Sodium: 135 mEq/L (ref 135–145)
Total Bilirubin: 0.2 mg/dL — ABNORMAL LOW (ref 0.3–1.2)
Total Protein: 4.9 g/dL — ABNORMAL LOW (ref 6.0–8.3)

## 2012-10-13 LAB — CBC
HCT: 25.2 % — ABNORMAL LOW (ref 36.0–46.0)
Hemoglobin: 8.6 g/dL — ABNORMAL LOW (ref 12.0–15.0)
MCH: 32 pg (ref 26.0–34.0)
MCV: 89.7 fL (ref 78.0–100.0)
MCV: 91.1 fL (ref 78.0–100.0)
Platelets: 101 10*3/uL — ABNORMAL LOW (ref 150–400)
Platelets: 97 10*3/uL — ABNORMAL LOW (ref 150–400)
RBC: 2.81 MIL/uL — ABNORMAL LOW (ref 3.87–5.11)
RBC: 2.69 MIL/uL — ABNORMAL LOW (ref 3.87–5.11)
RDW: 14.3 % (ref 11.5–15.5)
WBC: 10.2 10*3/uL (ref 4.0–10.5)

## 2012-10-13 SURGERY — LIGATION, FALLOPIAN TUBE, POSTPARTUM
Anesthesia: Epidural | Site: Abdomen | Laterality: Bilateral | Wound class: Clean Contaminated

## 2012-10-13 MED ORDER — OXYCODONE HCL 5 MG/5ML PO SOLN: 5.0000 mg | Freq: Once | ORAL | Status: DC | PRN

## 2012-10-13 MED ORDER — PROMETHAZINE HCL 25 MG/ML IJ SOLN: 6.2500 mg | INTRAMUSCULAR | Status: DC | PRN

## 2012-10-13 MED ORDER — OXYCODONE HCL 5 MG PO TABS: 5.0000 mg | ORAL_TABLET | Freq: Once | ORAL | Status: DC | PRN

## 2012-10-13 MED ORDER — METOCLOPRAMIDE HCL 10 MG PO TABS
10.0000 mg | ORAL_TABLET | Freq: Once | ORAL | Status: AC
  Administered 2012-10-13: 10 mg via ORAL
  Filled 2012-10-13: qty 1

## 2012-10-13 MED ORDER — FAMOTIDINE 20 MG PO TABS
40.0000 mg | ORAL_TABLET | Freq: Once | ORAL | Status: AC
  Administered 2012-10-13: 40 mg via ORAL
  Filled 2012-10-13: qty 2

## 2012-10-13 MED ORDER — NIFEDIPINE ER 30 MG PO TB24
30.0000 mg | ORAL_TABLET | Freq: Once | ORAL | Status: AC
  Administered 2012-10-13: 30 mg via ORAL
  Filled 2012-10-13: qty 1

## 2012-10-13 MED ORDER — BUPIVACAINE-EPINEPHRINE (PF) 0.5% -1:200000 IJ SOLN
INTRAMUSCULAR | Status: AC
  Filled 2012-10-13: qty 10

## 2012-10-13 MED ORDER — LACTATED RINGERS IV SOLN
INTRAVENOUS | Status: DC | PRN
  Administered 2012-10-13: 09:00:00 via INTRAVENOUS

## 2012-10-13 MED ORDER — MEPERIDINE HCL 25 MG/ML IJ SOLN: 6.2500 mg | INTRAMUSCULAR | Status: DC | PRN

## 2012-10-13 MED ORDER — SODIUM BICARBONATE 8.4 % IV SOLN
INTRAVENOUS | Status: AC
  Filled 2012-10-13: qty 50

## 2012-10-13 MED ORDER — MIDAZOLAM HCL 2 MG/2ML IJ SOLN
INTRAMUSCULAR | Status: DC | PRN
  Administered 2012-10-13 (×2): 1 mg via INTRAVENOUS

## 2012-10-13 MED ORDER — BUPIVACAINE-EPINEPHRINE 0.5% -1:200000 IJ SOLN
INTRAMUSCULAR | Status: DC | PRN
  Administered 2012-10-13: 4 mL

## 2012-10-13 MED ORDER — HYDROMORPHONE HCL PF 1 MG/ML IJ SOLN: 0.2500 mg | INTRAMUSCULAR | Status: DC | PRN

## 2012-10-13 MED ORDER — ONDANSETRON HCL 4 MG/2ML IJ SOLN
INTRAMUSCULAR | Status: DC | PRN
  Administered 2012-10-13: 4 mg via INTRAVENOUS

## 2012-10-13 MED ORDER — SODIUM BICARBONATE 8.4 % IV SOLN
INTRAVENOUS | Status: DC | PRN
  Administered 2012-10-13: 3 mL via EPIDURAL
  Administered 2012-10-13: 2 mL via EPIDURAL
  Administered 2012-10-13: 5 mL via EPIDURAL
  Administered 2012-10-13: 3 mL via EPIDURAL
  Administered 2012-10-13: 5 mL via EPIDURAL

## 2012-10-13 MED ORDER — ONDANSETRON HCL 4 MG/2ML IJ SOLN
INTRAMUSCULAR | Status: AC
  Filled 2012-10-13: qty 2

## 2012-10-13 MED ORDER — LIDOCAINE-EPINEPHRINE (PF) 2 %-1:200000 IJ SOLN
INTRAMUSCULAR | Status: AC
  Filled 2012-10-13: qty 20

## 2012-10-13 MED ORDER — MIDAZOLAM HCL 2 MG/2ML IJ SOLN
INTRAMUSCULAR | Status: AC
  Filled 2012-10-13: qty 2

## 2012-10-13 MED ORDER — 0.9 % SODIUM CHLORIDE (POUR BTL) OPTIME
TOPICAL | Status: DC | PRN
  Administered 2012-10-13: 1000 mL

## 2012-10-13 SURGICAL SUPPLY — 29 items
CHLORAPREP W/TINT 26ML (MISCELLANEOUS) ×2 IMPLANT
CLOTH BEACON ORANGE TIMEOUT ST (SAFETY) ×2 IMPLANT
CONTAINER PREFILL 10% NBF 15ML (MISCELLANEOUS) ×4 IMPLANT
DRSG COVADERM PLUS 2X2 (GAUZE/BANDAGES/DRESSINGS) ×1 IMPLANT
ELECT REM PT RETURN 9FT ADLT (ELECTROSURGICAL) ×2
ELECTRODE REM PT RTRN 9FT ADLT (ELECTROSURGICAL) IMPLANT
GLOVE BIOGEL PI IND STRL 6.5 (GLOVE) IMPLANT
GLOVE BIOGEL PI IND STRL 7.0 (GLOVE) IMPLANT
GLOVE BIOGEL PI IND STRL 8.5 (GLOVE) ×1 IMPLANT
GLOVE BIOGEL PI INDICATOR 6.5 (GLOVE) ×2
GLOVE BIOGEL PI INDICATOR 7.0 (GLOVE) ×2
GLOVE BIOGEL PI INDICATOR 8.5 (GLOVE) ×1
GLOVE ECLIPSE 8.0 STRL XLNG CF (GLOVE) ×3 IMPLANT
GOWN PREVENTION PLUS LG XLONG (DISPOSABLE) ×2 IMPLANT
GOWN PREVENTION PLUS XXLARGE (GOWN DISPOSABLE) ×2 IMPLANT
NDL HYPO 25X1 1.5 SAFETY (NEEDLE) ×1 IMPLANT
NEEDLE HYPO 25X1 1.5 SAFETY (NEEDLE) ×2 IMPLANT
NS IRRIG 1000ML POUR BTL (IV SOLUTION) ×2 IMPLANT
PACK ABDOMINAL MINOR (CUSTOM PROCEDURE TRAY) ×2 IMPLANT
PENCIL BUTTON HOLSTER BLD 10FT (ELECTRODE) IMPLANT
SPONGE LAP 4X18 X RAY DECT (DISPOSABLE) ×1 IMPLANT
SUT MNCRL AB 3-0 PS2 27 (SUTURE) ×2 IMPLANT
SUT PLAIN 0 NONE (SUTURE) ×2 IMPLANT
SUT VIC AB 2-0 CT1 27 (SUTURE) ×2
SUT VIC AB 2-0 CT1 TAPERPNT 27 (SUTURE) ×1 IMPLANT
SYR CONTROL 10ML LL (SYRINGE) ×2 IMPLANT
TOWEL OR 17X24 6PK STRL BLUE (TOWEL DISPOSABLE) ×4 IMPLANT
TRAY FOLEY CATH 14FR (SET/KITS/TRAYS/PACK) ×2 IMPLANT
WATER STERILE IRR 1000ML POUR (IV SOLUTION) ×1 IMPLANT

## 2012-10-13 NOTE — Op Note (Signed)
OPERATIVE NOTE   Raven Bauer   DOB: 1970-07-01   MRN: [id number]   CSN: [id number]   Date of Surgery: 10/13/2012   Preoperative Diagnosis:   Postpartum day # 1  Desires Sterilization   Postoperative Diagnosis:   Same   Procedure:   Modified [place] Postpartum Bilateral Tubal Sterilization Procedure   Surgeon:   Leonard Schwartz, M.D.   Assistant:   None   Anesthetic:   Epidural  Disposition:   The patient is a 42 y.o.-year-old female, now [id number], who desires sterilization. She understands the indications for surgical procedure. She accepts the risk of, but not limited to, anesthetic complications, bleeding, infections, possible damage to the surrounding organs, and possible tubal failure (17 per 1000).   Findings:   The fallopian tubes were normal bilaterally.   Procedure:   The patient was taken to the operating room where  Epidural anesthesia was noted to be adequate for surgery. The patient's abdomen was prepped with multiple layers of ChloraPrep. A foley catheter was placed after sterile preparation. She was then sterilely draped. The subumbilical area was injected with half percent Marcaine with epinephrine. A subumbilical incision was made and carried sharply through the subcutaneous tissue, the fascia, and the anterior peritoneum. The left fallopian tube was identified and followed to its fimbriated end. A knuckle of tube was made on the left using a free tie and individual ties of 0 plain catgut suture. The knuckle of tube was then excised. Hemostasis was adequate. An identical procedure was carried out on the opposite side. Again hemostasis was adequate. The fascia and the anterior peritoneum were closed using a running suture of 2-0 Vicryl. The skin was reapproximated using a subcuticular suture of 3-0 Monocryl. Sponge, needle, and instrument counts were correct on 2 occasions. The estimated blood loss was 5 cc's. The patient tolerated her procedure  well. She was transported to the recovery room in stable condition. The cut portions of the fallopian tubes were sent to pathology.   Leonard Schwartz, M.D.

## 2012-10-13 NOTE — Anesthesia Postprocedure Evaluation (Addendum)
Anesthesia Post Note  Patient: Raven Bauer  Procedure(s) Performed: Procedure(s) (LRB): POST PARTUM TUBAL LIGATION Bilateral (Bilateral)  Anesthesia type: Epidural  Patient location: PACU  Post pain: Pain level controlled  Post assessment: Post-op Vital signs reviewed  Last Vitals: BP 150/98  Pulse 76  Temp(Src) 36.6 C (Oral)  Resp 18  Ht 5\' 9"  (1.753 m)  Wt 208 lb 6.4 oz (94.53 kg)  BMI 30.76 kg/m2  SpO2 99%  LMP 02/02/2012  Post vital signs: Reviewed  Level of consciousness: sedated  Complications: No apparent anesthesia complications  Epidural: Will follow up with AM labs to ensure platelet count dose not continue to drop prior to epidural removal.

## 2012-10-13 NOTE — Transfer of Care (Signed)
Immediate Anesthesia Transfer of Care Note  Patient: Raven Bauer  Procedure(s) Performed: Procedure(s): POST PARTUM TUBAL LIGATION Bilateral (Bilateral)  Patient Location: PACU  Anesthesia Type:Epidural  Level of Consciousness: awake  Airway & Oxygen Therapy: Patient Spontanous Breathing  Post-op Assessment: Report given to PACU RN and Post -op Vital signs reviewed and stable  Post vital signs: Reviewed and stable  Complications: No apparent anesthesia complications

## 2012-10-13 NOTE — Progress Notes (Signed)
BP elevated. Began Procardia XL 30.  Plan discharge tomorrow. Check labs in the morning.  Dr. Stefano Gaul

## 2012-10-13 NOTE — Anesthesia Preprocedure Evaluation (Signed)
Anesthesia Evaluation  Patient identified by MRN, date of birth, ID band Patient awake    Reviewed: Allergy & Precautions, H&P , NPO status , Patient's Chart, lab work & pertinent test results  Airway Mallampati: II TM Distance: >3 FB Neck ROM: Full    Dental   Pulmonary neg pulmonary ROS,  breath sounds clear to auscultation        Cardiovascular negative cardio ROS  Rhythm:Regular     Neuro/Psych  Headaches, negative psych ROS   GI/Hepatic negative GI ROS, Neg liver ROS,   Endo/Other  negative endocrine ROS  Renal/GU negative Renal ROS     Musculoskeletal negative musculoskeletal ROS (+)   Abdominal   Peds  Hematology negative hematology ROS (+)   Anesthesia Other Findings   Reproductive/Obstetrics (+) Pregnancy                           Anesthesia Physical  Anesthesia Plan  ASA: II  Anesthesia Plan: Epidural   Post-op Pain Management:    Induction:   Airway Management Planned:   Additional Equipment:   Intra-op Plan:   Post-operative Plan:   Informed Consent: I have reviewed the patients History and Physical, chart, labs and discussed the procedure including the risks, benefits and alternatives for the proposed anesthesia with the patient or authorized representative who has indicated his/her understanding and acceptance.   Dental advisory given  Plan Discussed with: CRNA  Anesthesia Plan Comments:         Anesthesia Quick Evaluation

## 2012-10-13 NOTE — Progress Notes (Signed)
Patient says that she ate some grapes at 0200 along with sips of water.  She "forgot" that she was NPO.  MD and Anesthesia aware.  Patient to remain on OR schedule for 0800.

## 2012-10-13 NOTE — Progress Notes (Signed)
Post Partum Day 1  Subjective: Slight headache.  Objective: Blood pressure 149/95, pulse 68, temperature 97.5 F (36.4 C), temperature source Oral, resp. rate 18, height 5\' 9"  (1.753 m), weight 208 lb 6.4 oz (94.53 kg), last menstrual period 02/02/2012, SpO2 100.00%, unknown if currently breastfeeding.  Physical Exam:  General: no distress Lochia: appropriate Uterine Fundus: firm Incision: NA DVT Evaluation: No evidence of DVT seen on physical exam.   Recent Labs  10/12/12 0458 10/13/12 0510  HGB 12.6 8.9*  HCT 35.1* 25.2*   Results for orders placed during the hospital encounter of 10/11/12 (from the past 24 hour(s))  MRSA PCR SCREENING     Status: None   Collection Time    10/12/12 11:11 AM      Result Value Range   MRSA by PCR NEGATIVE  NEGATIVE  CBC     Status: Abnormal   Collection Time    10/13/12  5:10 AM      Result Value Range   WBC 10.2  4.0 - 10.5 K/uL   RBC 2.81 (*) 3.87 - 5.11 MIL/uL   Hemoglobin 8.9 (*) 12.0 - 15.0 g/dL   HCT 16.1 (*) 09.6 - 04.5 %   MCV 89.7  78.0 - 100.0 fL   MCH 31.7  26.0 - 34.0 pg   MCHC 35.3  30.0 - 36.0 g/dL   RDW 40.9  81.1 - 91.4 %   Platelets 101 (*) 150 - 400 K/uL  COMPREHENSIVE METABOLIC PANEL     Status: Abnormal   Collection Time    10/13/12  5:10 AM      Result Value Range   Sodium 135  135 - 145 mEq/L   Potassium 3.9  3.5 - 5.1 mEq/L   Chloride 102  96 - 112 mEq/L   CO2 24  19 - 32 mEq/L   Glucose, Bld 75  70 - 99 mg/dL   BUN 5 (*) 6 - 23 mg/dL   Creatinine, Ser 7.82  0.50 - 1.10 mg/dL   Calcium 6.6 (*) 8.4 - 10.5 mg/dL   Total Protein 4.9 (*) 6.0 - 8.3 g/dL   Albumin 2.0 (*) 3.5 - 5.2 g/dL   AST 65 (*) 0 - 37 U/L   ALT 58 (*) 0 - 35 U/L   Alkaline Phosphatase 119 (*) 39 - 117 U/L   Total Bilirubin 0.2 (*) 0.3 - 1.2 mg/dL   GFR calc non Af Amer 90 (*) >90 mL/min   GFR calc Af Amer >90  >90 mL/min    Assessment/Plan: BTL  Magn DC'ed.   LOS: 2 days   Janine Limbo 10/13/2012, 9:34 AM

## 2012-10-14 DIAGNOSIS — O1424 HELLP syndrome, complicating childbirth: Secondary | ICD-10-CM | POA: Diagnosis present

## 2012-10-14 DIAGNOSIS — D696 Thrombocytopenia, unspecified: Secondary | ICD-10-CM | POA: Diagnosis present

## 2012-10-14 DIAGNOSIS — D62 Acute posthemorrhagic anemia: Secondary | ICD-10-CM | POA: Diagnosis not present

## 2012-10-14 DIAGNOSIS — R748 Abnormal levels of other serum enzymes: Secondary | ICD-10-CM | POA: Diagnosis present

## 2012-10-14 DIAGNOSIS — Z302 Encounter for sterilization: Secondary | ICD-10-CM

## 2012-10-14 LAB — CBC
HCT: 24.4 % — ABNORMAL LOW (ref 36.0–46.0)
MCHC: 34.8 g/dL (ref 30.0–36.0)
MCV: 92.4 fL (ref 78.0–100.0)
Platelets: 100 10*3/uL — ABNORMAL LOW (ref 150–400)
RDW: 14.9 % (ref 11.5–15.5)
WBC: 8.7 10*3/uL (ref 4.0–10.5)

## 2012-10-14 LAB — COMPREHENSIVE METABOLIC PANEL
ALT: 78 U/L — ABNORMAL HIGH (ref 0–35)
AST: 98 U/L — ABNORMAL HIGH (ref 0–37)
Albumin: 2.1 g/dL — ABNORMAL LOW (ref 3.5–5.2)
Albumin: 2.2 g/dL — ABNORMAL LOW (ref 3.5–5.2)
Alkaline Phosphatase: 119 U/L — ABNORMAL HIGH (ref 39–117)
Alkaline Phosphatase: 130 U/L — ABNORMAL HIGH (ref 39–117)
BUN: 6 mg/dL (ref 6–23)
Chloride: 103 mEq/L (ref 96–112)
Creatinine, Ser: 0.83 mg/dL (ref 0.50–1.10)
Creatinine, Ser: 0.82 mg/dL (ref 0.50–1.10)
GFR calc Af Amer: >90 mL/min (ref >90)
GFR calc Af Amer: >90 mL/min (ref >90)
GFR calc non Af Amer: 88 mL/min — ABNORMAL LOW (ref >90)
Glucose, Bld: 81 mg/dL (ref 70–99)
Glucose, Bld: 78 mg/dL (ref 70–99)
Potassium: 4.2 mEq/L (ref 3.5–5.1)
Potassium: 4.4 mEq/L (ref 3.5–5.1)
Sodium: 135 mEq/L (ref 135–145)
Total Bilirubin: 0.3 mg/dL (ref 0.3–1.2)
Total Protein: 5.5 g/dL — ABNORMAL LOW (ref 6.0–8.3)

## 2012-10-14 MED ORDER — IBUPROFEN 600 MG PO TABS: 600.0000 mg | ORAL_TABLET | Freq: Four times a day (QID) | ORAL | Status: DC

## 2012-10-14 MED ORDER — NIFEDIPINE ER 60 MG PO TB24: 60.0000 mg | ORAL_TABLET | Freq: Every day | ORAL | Status: DC

## 2012-10-14 MED ORDER — OXYCODONE-ACETAMINOPHEN 5-325 MG PO TABS: 1.0000 | ORAL_TABLET | ORAL | Status: DC | PRN

## 2012-10-14 MED ORDER — NIFEDIPINE ER 60 MG PO TB24
60.0000 mg | ORAL_TABLET | Freq: Every day | ORAL | Status: DC
  Administered 2012-10-14: 60 mg via ORAL
  Filled 2012-10-14 (×2): qty 1

## 2012-10-14 NOTE — Anesthesia Postprocedure Evaluation (Signed)
  Anesthesia Post-op Note  Patient: Raven Bauer  Procedure(s) Performed: Procedure(s): POST PARTUM TUBAL LIGATION Bilateral (Bilateral)  Patient Location: Mother/Baby  Anesthesia Type:Epidural  Level of Consciousness: awake  Airway and Oxygen Therapy: Patient Spontanous Breathing  Post-op Pain: mild  Post-op Assessment: Patient's Cardiovascular Status Stable and Respiratory Function Stable  Post-op Vital Signs: stable  Complications: No apparent anesthesia complications

## 2012-10-14 NOTE — Progress Notes (Signed)
Dr. Pennie Rushing notified of BP of 151/101. Patient does not have a headache. Patient told to Follow up in the office on Wednesday for a BP check and Lab check. Patient understood all DC teaching.

## 2012-10-14 NOTE — Lactation Note (Addendum)
This note was copied from the chart of Raven Bauer. Lactation Consultation Note  Patient Name: Raven Bauer [id number] Date: 10/14/2012 Reason for consult: Follow-up assessment;Infant < 6lbs;Late preterm infant Mom reports she was assisted with breastfeeding by Anette Guarneri this am. Mom reports breastfeeding is going well, baby has been cluster feeding. Advised Mom to call if she would like LC assist with breastfeeding.  Mom reports if she goes home today, she will follow up Wednesday with Dr. Roxy Cedar and Ms. Boette for lactation.  Maternal Data    Feeding    LATCH Score/Interventions                      Lactation Tools Discussed/Used     Consult Status Consult Status: Complete Date: 10/14/12 Follow-up type: In-patient    Alfred Levins 10/14/2012, 2:01 PM

## 2012-10-14 NOTE — Addendum Note (Signed)
Addendum created 10/14/12 0958 by Renford Dills, CRNA   Modules edited: Anesthesia LDA

## 2012-10-14 NOTE — Discharge Summary (Addendum)
   Obstetric Discharge Summary Reason for Admission: pre-eclampsia Prenatal Procedures: Preeclampsia workup Intrapartum Procedures: spontaneous vaginal delivery Postpartum Procedures: P.P. tubal ligation Complications-Operative and Postpartum: first degree perineal laceration  Temp:  [97.6 F (36.4 C)-98.2 F (36.8 C)] 97.6 F (36.4 C) (09/29 0600) Pulse Rate:  [63-102] 67 (09/29 1600) Resp:  [20] 20 (09/29 0600) BP: (121-153)/(79-102) 153/101 mmHg (09/29 1600) SpO2:  [97 %] 97 % (09/29 1030) Hemoglobin  Date Value Range Status  10/14/2012 8.5* 12.0 - 15.0 g/dL Final     HCT  Date Value Range Status  10/14/2012 24.4* 36.0 - 46.0 % Final    Hospital Course:  Hospital Course: Admitted in and is for evaluate for preeclampsia. She was noted to have hypertension and elevated liver enzymes and low platelets. neg GBS. She underwent induction of labor.. Delivery was performed by Haroldine Laws without difficulty. Patient and baby tolerated the procedure without difficulty, with a first degree laceration noted. Infant to FTN. Infant then had an uncomplicated postpartum course, with breast feeding going well. Mom received 24 hours of magnesium sulfate postpartum. She required antihypertensives after the magnesium was discontinued., first Procardia 30 mg and Procardia XL 60 mg daily. Her blood pressure was reasonably controlled on the latter dose.Mom's physical exam was WNL, and she was discharged home in stable condition with continued, but stable thrombocytopenia and elevated liver enzyme. She will have her liver enzymes repeated one week postpartum in the office with a blood pressure check. She underwent a postpartum tubal sterilization on postpartum day one without difficulty.  She received adequate benefit from po pain medications.  Discharge Diagnoses: Pregnancy at 36 weeks and 1 day delivered     Preeclampsia     Elevated liver enzymes     Thrombocytopenia     HELLP syndrome Discharge  Information: Date: 10/14/2012 Activity: pelvic rest Diet: routine Medications:    Medication List         ibuprofen 600 MG tablet  Commonly known as:  ADVIL,MOTRIN  Take 1 tablet (600 mg total) by mouth every 6 (six) hours.     Iron Tabs  Take 1 tablet by mouth daily. With orange juice on an empty stomach.     NIFEdipine 60 MG 24 hr tablet  Commonly known as:  PROCARDIA-XL/ADALAT CC  Take 1 tablet (60 mg total) by mouth daily.     oxyCODONE-acetaminophen 5-325 MG per tablet  Commonly known as:  PERCOCET/ROXICET  Take 1-2 tablets by mouth every 4 (four) hours as needed.     pantoprazole 40 MG tablet  Commonly known as:  PROTONIX  Take 40 mg by mouth every evening.     prenatal multivitamin Tabs tablet  Take 1 tablet by mouth at bedtime.       Condition: improved Instructions: refer to practice specific booklet Discharge to: home Follow-up Information   Follow up with [facility] In 2 days. (for blood pressure and labs at time of circumcision)    Specialty:  Obstetrics and Gynecology   Contact information:   [address] [place] [address] [phone]      Newborn Data: Live born  Information for the patient's newborn:  Jayleah, Garbers [[id number]]  female  ; APGAR 9 9, ; weight ; 4lb 12 oz Home with mother.  John Williamsen P 10/14/2012, 6:36 PM

## 2012-10-14 NOTE — Progress Notes (Signed)
Patient c/o having headache and abdominal discomfort at incisional site, 0 clonus or reflexes at this time. B/P 148 /102. Patient medicated with Percocet and instructed to lie on side. Will assess status in approximately  30 minutes.

## 2012-10-15 ENCOUNTER — Encounter (HOSPITAL_COMMUNITY): Payer: Self-pay | Admitting: Obstetrics and Gynecology

## 2012-10-18 NOTE — Progress Notes (Signed)
Post discharge chart review completed.  

## 2012-10-25 ENCOUNTER — Ambulatory Visit (HOSPITAL_COMMUNITY)
Admission: RE | Admit: 2012-10-25 | Discharge: 2012-10-25 | Disposition: A | Discharge status: Home / Self Care | Payer: BC Managed Care – PPO | Source: Ambulatory Visit | Attending: Obstetrics and Gynecology | Admitting: Obstetrics and Gynecology

## 2012-10-25 NOTE — Lactation Note (Signed)
Adult Lactation Consultation Outpatient Visit Note      I saw mom today at [facility]. She was trying to find a 5 french feeding tube to do SNS with her 56 week old baby. I had  Her come into the lactatiton outpatient office, and I showed her how to use the starter SNS kit. I gave her 2, since she thinks she will be using this for about 2 more weeks. I also  advised mom to keep pumping, to increase her milk supply, and showed her how to hand express for this reason also.     Patient Name: Raven Bauer Date of Birth: 1970-03-26 Gestational Age at Delivery: [redacted]w[redacted]d Type of Delivery:   Breastfeeding History: Frequency of Breastfeeding:  Length of Feeding:  Voids:  Stools:   Supplementing / Method: Pumping:  Type of Pump:   Frequency:  Volume:    Comments:    Consultation Evaluation:  Initial Feeding Assessment: Pre-feed Weight: Post-feed Weight: Amount Transferred: Comments:  Additional Feeding Assessment: Pre-feed Weight: Post-feed Weight: Amount Transferred: Comments:  Additional Feeding Assessment: Pre-feed Weight: Post-feed Weight: Amount Transferred: Comments:  Total Breast milk Transferred this Visit:  Total Supplement Given:   Additional Interventions:   Follow-Up   With Dr. Roxy Cedar and Anette Guarneri      Alfred Levins 10/25/2012, 7:01 PM

## 2012-11-21 ENCOUNTER — Other Ambulatory Visit: Payer: Self-pay

## 2013-02-10 ENCOUNTER — Encounter: Payer: Self-pay | Admitting: Family

## 2013-02-10 ENCOUNTER — Ambulatory Visit (INDEPENDENT_AMBULATORY_CARE_PROVIDER_SITE_OTHER): Payer: BC Managed Care – PPO | Admitting: Family

## 2013-02-10 VITALS — BP 128/80 | HR 70 | Ht 67.0 in | Wt 180.0 lb

## 2013-02-10 DIAGNOSIS — Z23 Encounter for immunization: Secondary | ICD-10-CM

## 2013-02-10 DIAGNOSIS — Z1231 Encounter for screening mammogram for malignant neoplasm of breast: Secondary | ICD-10-CM

## 2013-02-10 DIAGNOSIS — Z0000 Encounter for general adult medical examination without abnormal findings: Secondary | ICD-10-CM

## 2013-02-10 DIAGNOSIS — K219 Gastro-esophageal reflux disease without esophagitis: Secondary | ICD-10-CM

## 2013-02-10 LAB — CBC WITH DIFFERENTIAL/PLATELET
BASOS ABS: 0 10*3/uL (ref 0.0–0.1)
BASOS PCT: 0.8 % (ref 0.0–3.0)
EOS ABS: 0 10*3/uL (ref 0.0–0.7)
Eosinophils Relative: 0.7 % (ref 0.0–5.0)
HCT: 36.8 % (ref 36.0–46.0)
Hemoglobin: 12.3 g/dL (ref 12.0–15.0)
Lymphocytes Relative: 49.7 % — ABNORMAL HIGH (ref 12.0–46.0)
Lymphs Abs: 2.3 10*3/uL (ref 0.7–4.0)
MCHC: 33.3 g/dL (ref 30.0–36.0)
MCV: 89.5 fl (ref 78.0–100.0)
MONO ABS: 0.4 10*3/uL (ref 0.1–1.0)
Monocytes Relative: 8.4 % (ref 3.0–12.0)
NEUTROS PCT: 40.4 % — AB (ref 43.0–77.0)
Neutro Abs: 1.9 10*3/uL (ref 1.4–7.7)
Platelets: 361 10*3/uL (ref 150.0–400.0)
RBC: 4.11 Mil/uL (ref 3.87–5.11)
RDW: 13.5 % (ref 11.5–14.6)
WBC: 4.7 10*3/uL (ref 4.5–10.5)

## 2013-02-10 LAB — POCT URINALYSIS DIPSTICK
Bilirubin, UA: NEGATIVE
Blood, UA: NEGATIVE
Glucose, UA: NEGATIVE
KETONES UA: NEGATIVE
LEUKOCYTES UA: NEGATIVE
Nitrite, UA: NEGATIVE
PH UA: 7.5
PROTEIN UA: NEGATIVE
Spec Grav, UA: 1.025
Urobilinogen, UA: 0.2

## 2013-02-10 LAB — COMPREHENSIVE METABOLIC PANEL
ALK PHOS: 65 U/L (ref 39–117)
ALT: 22 U/L (ref 0–35)
AST: 21 U/L (ref 0–37)
Albumin: 4.2 g/dL (ref 3.5–5.2)
BUN: 10 mg/dL (ref 6–23)
CHLORIDE: 104 meq/L (ref 96–112)
CO2: 28 mEq/L (ref 19–32)
Calcium: 9.3 mg/dL (ref 8.4–10.5)
Creatinine, Ser: 0.8 mg/dL (ref 0.4–1.2)
GFR: 104.93 mL/min (ref >60.00)
Glucose, Bld: 80 mg/dL (ref 70–99)
Potassium: 4.4 mEq/L (ref 3.5–5.1)
SODIUM: 138 meq/L (ref 135–145)
TOTAL PROTEIN: 7.5 g/dL (ref 6.0–8.3)
Total Bilirubin: 0.5 mg/dL (ref 0.3–1.2)

## 2013-02-10 LAB — TSH: TSH: 0.73 u[IU]/mL (ref 0.35–5.50)

## 2013-02-10 NOTE — Patient Instructions (Signed)

## 2013-02-10 NOTE — Progress Notes (Signed)
Pre visit review using our clinic review tool, if applicable. No additional management support is needed unless otherwise documented below in the visit note. 

## 2013-02-10 NOTE — Progress Notes (Signed)
   Subjective:    Patient ID: Raven Bauer, female    DOB: 23-Jan-1970, 43 y.o.   MRN: [id number]  HPI  Raven Bauer is a 43 year old black female who presents today for a physical. This is a routine physical examination for this healthy  Female. Reviewed all health maintenance protocols including mammography colonoscopy  and reviewed appropriate screening labs. Her immunization history was reviewed as well as her current medications and allergies refills of her chronic medications were given and the plan for yearly health maintenance was discussed all orders and referrals were made as appropriate. Patient is 41 months post pregnancy and had pre-eclampsia. Pt states both her and baby are "doing well". Pt breast feeding and taking supplement for breast milk.     Review of Systems  Constitutional: Negative.   HENT: Negative.   Eyes: Negative.   Respiratory: Negative.   Cardiovascular: Negative.   Gastrointestinal: Negative.   Endocrine: Negative.   Genitourinary: Negative.   Musculoskeletal: Negative.   Skin: Negative.   Allergic/Immunologic: Negative.   Neurological: Negative.   Hematological: Negative.   Psychiatric/Behavioral: Negative.        Objective:   Physical Exam  Constitutional: She is oriented to person, place, and time. She appears well-developed and well-nourished. She is active.  HENT:  Head: Normocephalic.  Right Ear: Hearing, tympanic membrane, external ear and ear canal normal.  Left Ear: Hearing, tympanic membrane, external ear and ear canal normal.  Nose: Nose normal.  Mouth/Throat: Uvula is midline, oropharynx is clear and moist and mucous membranes are normal.  Eyes: Conjunctivae, EOM and lids are normal. Pupils are equal, round, and reactive to light.  Neck: Trachea normal and normal range of motion.  Cardiovascular: Normal rate, regular rhythm, normal heart sounds, intact distal pulses and normal pulses.   Pulmonary/Chest: Effort normal and breath sounds  normal.  Abdominal: Soft. Normal appearance and bowel sounds are normal.  Musculoskeletal: Normal range of motion.  Neurological: She is alert and oriented to person, place, and time. She has normal strength and normal reflexes.  Skin: Skin is warm, dry and intact.  Psychiatric: Her speech is normal and behavior is normal. Thought content normal. Cognition and memory are normal. She exhibits a depressed mood.          Assessment & Plan:  43 y.o. Black female that presents for a physical today.  - CBC, CMP, TSH, Urinalysis  - EKG -Tetanus Shot  - Recommend having mammogram in the next year, pt currently breast feeding.  Postpartum Blues: pt counseled on condition and presentation - Instructed to call if she feels that she is having further problems  - Pt currently feels she "is handling it".  Education- Its ok to ask for help when dealing with baby blue  - Eat health diet, high in fiber to help with bowel movements.  - Lots of veggies and fruits

## 2013-10-31 ENCOUNTER — Other Ambulatory Visit: Payer: Self-pay

## 2013-11-12 ENCOUNTER — Encounter: Payer: Self-pay | Admitting: Family

## 2013-11-17 ENCOUNTER — Encounter: Payer: Self-pay | Admitting: Family

## 2013-12-03 NOTE — Telephone Encounter (Signed)
Pt does have to do the lipid for her work. Pt has scheduled for 11/24.  Pt will cb and schedule cpe after this lab thanks

## 2013-12-09 ENCOUNTER — Other Ambulatory Visit (INDEPENDENT_AMBULATORY_CARE_PROVIDER_SITE_OTHER): Payer: BC Managed Care – PPO

## 2013-12-09 DIAGNOSIS — E785 Hyperlipidemia, unspecified: Secondary | ICD-10-CM

## 2013-12-09 LAB — LIPID PANEL
Cholesterol: 173 mg/dL (ref 0–200)
HDL: 47.6 mg/dL (ref >39.00)
LDL CALC: 115 mg/dL — AB (ref 0–99)
NonHDL: 125.4
Total CHOL/HDL Ratio: 4
Triglycerides: 50 mg/dL (ref 0.0–149.0)
VLDL: 10 mg/dL (ref 0.0–40.0)

## 2013-12-16 ENCOUNTER — Other Ambulatory Visit: Payer: Self-pay | Admitting: Obstetrics and Gynecology

## 2013-12-16 DIAGNOSIS — Z1231 Encounter for screening mammogram for malignant neoplasm of breast: Secondary | ICD-10-CM

## 2013-12-17 ENCOUNTER — Encounter: Payer: Self-pay | Admitting: Family

## 2014-06-17 ENCOUNTER — Ambulatory Visit: Payer: BC Managed Care – PPO

## 2015-02-24 ENCOUNTER — Encounter: Payer: Self-pay | Admitting: Primary Care

## 2015-02-24 ENCOUNTER — Ambulatory Visit (INDEPENDENT_AMBULATORY_CARE_PROVIDER_SITE_OTHER): Payer: 59 | Admitting: Primary Care

## 2015-02-24 VITALS — BP 118/72 | HR 100 | Temp 97.9°F | Ht 67.0 in | Wt 195.8 lb

## 2015-02-24 DIAGNOSIS — R079 Chest pain, unspecified: Secondary | ICD-10-CM

## 2015-02-24 DIAGNOSIS — R7303 Prediabetes: Secondary | ICD-10-CM | POA: Insufficient documentation

## 2015-02-24 NOTE — Progress Notes (Signed)
Pre visit review using our clinic review tool, if applicable. No additional management support is needed unless otherwise documented below in the visit note. 

## 2015-02-24 NOTE — Assessment & Plan Note (Signed)
Discovered at GYN office last week. A1C of 5.8. Poor diet and does not exercise. Discussed the importance of a healthy diet and regular exercise in order for weight loss and to reduce risk of other medical diseases. Will recheck A1C in 3 months.
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
## 2015-02-24 NOTE — Patient Instructions (Addendum)
Your ECG is normal.  It is important that you improve your diet. Please limit carbohydrates in the form of white bread, rice, pasta, cookies, sugary drinks, etc. Increase your consumption of fresh fruits and vegetables.  You need to consume about 2 liters of water daily.  Start exercising. You should be getting 1 hour of moderate intensity exercise 5 days weekly.  Download the APP "My Fitness Pal" to help track calories. Your goal would be to consume 1200-1400 calories daily.  Schedule a lab only appointment in 3 months for re-check of the diabetes test.  Please schedule a physical with me in 6 months. We will do same day labs if necessary.  It was a pleasure to meet you today! Please don't hesitate to call me with any questions. Welcome to [facility]!

## 2015-05-17 ENCOUNTER — Other Ambulatory Visit: Payer: Self-pay | Admitting: Primary Care

## 2015-05-17 DIAGNOSIS — E785 Hyperlipidemia, unspecified: Secondary | ICD-10-CM

## 2015-05-17 DIAGNOSIS — R7303 Prediabetes: Secondary | ICD-10-CM

## 2015-05-24 ENCOUNTER — Encounter: Payer: Self-pay | Admitting: Primary Care

## 2015-05-24 ENCOUNTER — Other Ambulatory Visit (INDEPENDENT_AMBULATORY_CARE_PROVIDER_SITE_OTHER): Payer: 59

## 2015-05-24 DIAGNOSIS — R7303 Prediabetes: Secondary | ICD-10-CM | POA: Diagnosis not present

## 2015-05-24 DIAGNOSIS — E785 Hyperlipidemia, unspecified: Secondary | ICD-10-CM | POA: Diagnosis not present

## 2015-05-24 LAB — LIPID PANEL
CHOL/HDL RATIO: 3
Cholesterol: 154 mg/dL (ref 0–200)
HDL: 50.3 mg/dL (ref >39.00)
LDL Cholesterol: 95 mg/dL (ref 0–99)
NonHDL: 104.05
TRIGLYCERIDES: 45 mg/dL (ref 0.0–149.0)
VLDL: 9 mg/dL (ref 0.0–40.0)

## 2015-05-24 LAB — HEMOGLOBIN A1C: Hgb A1c MFr Bld: 5.8 % (ref 4.6–6.5)

## 2016-01-21 ENCOUNTER — Encounter: Payer: Self-pay | Admitting: Primary Care

## 2016-01-21 ENCOUNTER — Ambulatory Visit (INDEPENDENT_AMBULATORY_CARE_PROVIDER_SITE_OTHER): Payer: BLUE CROSS/BLUE SHIELD | Admitting: Primary Care

## 2016-01-21 VITALS — BP 120/82 | HR 69 | Temp 98.1°F | Ht 67.0 in | Wt 185.0 lb

## 2016-01-21 DIAGNOSIS — R7303 Prediabetes: Secondary | ICD-10-CM | POA: Diagnosis not present

## 2016-01-21 LAB — BASIC METABOLIC PANEL
BUN: 12 mg/dL (ref 6–23)
CO2: 29 meq/L (ref 19–32)
Calcium: 9.1 mg/dL (ref 8.4–10.5)
Chloride: 103 mEq/L (ref 96–112)
Creatinine, Ser: 0.82 mg/dL (ref 0.40–1.20)
GFR: 97.65 mL/min (ref >60.00)
GLUCOSE: 93 mg/dL (ref 70–99)
Potassium: 4.5 mEq/L (ref 3.5–5.1)
SODIUM: 138 meq/L (ref 135–145)

## 2016-01-21 LAB — HEMOGLOBIN A1C: Hgb A1c MFr Bld: 5.7 % (ref 4.6–6.5)

## 2016-01-21 NOTE — Progress Notes (Signed)
Pre visit review using our clinic review tool, if applicable. No additional management support is needed unless otherwise documented below in the visit note. 

## 2016-01-21 NOTE — Patient Instructions (Signed)
Complete lab work prior to leaving today.   Continue your efforts towards a healthy lifestyle through diet and exercise. Continue to reduce consumption of sweets, processed food, fried/fatty foods.  Start exercising. You should be getting 150 minutes of moderate intensity exercise weekly.  Ensure you are consuming 64 ounces of water daily.  We will be in touch later today or tomorrow! It was a pleasure to see you today!   Prediabetes Prediabetes is the condition of having a blood sugar (blood glucose) level that is higher than it should be, but not high enough for you to be diagnosed with type 2 diabetes. Having prediabetes puts you at risk for developing type 2 diabetes (type 2 diabetes mellitus). Prediabetes may be called impaired glucose tolerance or impaired fasting glucose. Prediabetes usually does not cause symptoms. Your health care provider can diagnose this condition with blood tests. You may be tested for prediabetes if you are overweight and if you have at least one other risk factor for prediabetes. Risk factors for prediabetes include:  Having a family member with type 2 diabetes.  Being overweight or obese.  Being older than age 44.  Being of American-Indian, African-American, Hispanic/Latino, or Asian/Pacific Islander descent.  Having an inactive (sedentary) lifestyle.  Having a history of gestational diabetes or polycystic ovarian syndrome (PCOS).  Having low levels of good cholesterol (HDL-C) or high levels of blood fats (triglycerides).  Having high blood pressure. What is blood glucose and how is blood glucose measured?   Blood glucose refers to the amount of glucose in your bloodstream. Glucose comes from eating foods that contain sugars and starches (carbohydrates) that the body breaks down into glucose. Your blood glucose level may be measured in mg/dL (milligrams per deciliter) or mmol/L (millimoles per liter).Your blood glucose may be checked with one or more  of the following blood tests:  A fasting blood glucose (FBG) test. You will not be allowed to eat (you will fast) for at least 8 hours before a blood sample is taken.  A normal range for FBG is 70-100 mg/dl (0.2-7.23.9-5.6 mmol/L).  An A1c (hemoglobin A1c) blood test. This test provides information about blood glucose control over the previous 2?3months.  An oral glucose tolerance test (OGTT). This test measures your blood glucose twice:  After fasting. This is your baseline level.  Two hours after you drink a beverage that contains glucose. You may be diagnosed with prediabetes:  If your FBG is 100?125 mg/dL (5.3-6.65.6-6.9 mmol/L).  If your A1c level is 5.7?6.4%.  If your OGGT result is 140?199 mg/dL (4.4-037.8-11 mmol/L). These blood tests may be repeated to confirm your diagnosis. What happens if blood glucose is too high? The pancreas produces a hormone (insulin) that helps move glucose from the bloodstream into cells. When cells in the body do not respond properly to insulin that the body makes (insulin resistance), excess glucose builds up in the blood instead of going into cells. As a result, high blood glucose (hyperglycemia) can develop, which can cause many complications. This is a symptom of prediabetes. What can happen if blood glucose stays higher than normal for a long time? Having high blood glucose for a long time is dangerous. Too much glucose in your blood can damage your nerves and blood vessels. Long-term damage can lead to complications from diabetes, which may include:  Heart disease.  Stroke.  Blindness.  Kidney disease.  Depression.  Poor circulation in the feet and legs, which could lead to surgical removal (amputation) in severe  cases. How can prediabetes be prevented from turning into type 2 diabetes?   To help prevent type 2 diabetes, take the following actions:  Be physically active.  Do moderate-intensity physical activity for at least 30 minutes on at least 5  days of the week, or as much as told by your health care provider. This could be brisk walking, biking, or water aerobics.  Ask your health care provider what activities are safe for you. A mix of physical activities may be best, such as walking, swimming, cycling, and strength training.  Lose weight as told by your health care provider.  Losing 5-7% of your body weight can reverse insulin resistance.  Your health care provider can determine how much weight loss is best for you and can help you lose weight safely.  Follow a healthy meal plan. This includes eating lean proteins, complex carbohydrates, fresh fruits and vegetables, low-fat dairy products, and healthy fats.  Follow instructions from your health care provider about eating or drinking restrictions.  Make an appointment to see a diet and nutrition specialist (registered dietitian) to help you create a healthy eating plan that is right for you.  Do not smoke or use any tobacco products, such as cigarettes, chewing tobacco, and e-cigarettes. If you need help quitting, ask your health care provider.  Take over-the-counter and prescription medicines as told by your health care provider. You may be prescribed medicines that help lower the risk of type 2 diabetes. This information is not intended to replace advice given to you by your health care provider. Make sure you discuss any questions you have with your health care provider. Document Released: 04/26/2015 Document Revised: 06/10/2015 Document Reviewed: 02/23/2015 Elsevier Interactive Patient Education  2017 [employer].

## 2016-01-21 NOTE — Assessment & Plan Note (Signed)
Weight loss of 10 pounds since her last visit, commended her on this success. Discussed the importance of a healthy diet and regular exercise in order for weight loss, and to reduce the risk of other medical diseases. Repeat A1C today. Continue to monitor.
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
## 2016-05-30 ENCOUNTER — Ambulatory Visit (INDEPENDENT_AMBULATORY_CARE_PROVIDER_SITE_OTHER): Payer: BLUE CROSS/BLUE SHIELD | Admitting: Family Medicine

## 2016-05-30 ENCOUNTER — Encounter: Payer: Self-pay | Admitting: Family Medicine

## 2016-05-30 DIAGNOSIS — R200 Anesthesia of skin: Secondary | ICD-10-CM

## 2016-05-30 DIAGNOSIS — M419 Scoliosis, unspecified: Secondary | ICD-10-CM | POA: Insufficient documentation

## 2016-05-30 HISTORY — DX: Anesthesia of skin: R20.0

## 2016-05-30 LAB — BASIC METABOLIC PANEL
BUN: 13 mg/dL (ref 6–23)
CO2: 29 meq/L (ref 19–32)
Calcium: 9.3 mg/dL (ref 8.4–10.5)
Chloride: 105 mEq/L (ref 96–112)
Creatinine, Ser: 0.74 mg/dL (ref 0.40–1.20)
GFR: 109.75 mL/min (ref >60.00)
GLUCOSE: 84 mg/dL (ref 70–99)
POTASSIUM: 4.8 meq/L (ref 3.5–5.1)
Sodium: 138 mEq/L (ref 135–145)

## 2016-05-30 LAB — TSH: TSH: 0.99 u[IU]/mL (ref 0.35–4.50)

## 2016-05-30 LAB — T3, FREE: T3 FREE: 3 pg/mL (ref 2.3–4.2)

## 2016-05-30 LAB — T4, FREE: Free T4: 0.85 ng/dL (ref 0.60–1.60)

## 2016-05-30 LAB — VITAMIN B12: Vitamin B-12: 618 pg/mL (ref 211–911)

## 2016-05-30 NOTE — Patient Instructions (Signed)
Please stop at the lab to set up to have labs drawn.  

## 2016-05-30 NOTE — Progress Notes (Signed)
   Subjective:    Patient ID: Raven Bauer, female    DOB: [birth year], 46 y.o.   MRN: [id number]  HPI   46 year old female pt of Graylon GunningKate Clark's presents with new onset right leg going to sleep off and on in last several months. TIngling sensation.  Increased frequency lately in last 3-4 week.  She has also started with pain behind right knee. Noted for a day 2-3 days ago, lasted 2-3 hours. Took ASA, went away. No exeritonal pain, no clear claudication.  Few weeks ago after sitting on knees gardening.. Legs felt weak and went to sleep.  She is worried about circulation and nerve issues.  Dad with DM, HTN, father with bad circulation in legs.   No low back pain, no specific weakness Hx of scoliosis.  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   Lab Results  Component Value Date   HGBA1C 5.7 01/21/2016     Review of Systems  Constitutional: Negative for fatigue and fever.  HENT: Negative for ear pain.   Eyes: Negative for pain.  Respiratory: Negative for chest tightness and shortness of breath.   Cardiovascular: Negative for chest pain, palpitations and leg swelling.  Gastrointestinal: Negative for abdominal pain.  Genitourinary: Negative for dysuria.        Objective:   Physical Exam  Constitutional: Vital signs are normal. She appears well-developed and well-nourished. She is cooperative.  Non-toxic appearance. She does not appear ill. No distress.  HENT:  Head: Normocephalic.  Right Ear: Hearing, tympanic membrane, external ear and ear canal normal. Tympanic membrane is not erythematous, not retracted and not bulging.  Left Ear: Hearing, tympanic membrane, external ear and ear canal normal. Tympanic membrane is not erythematous, not retracted and not bulging.  Nose: No mucosal edema or rhinorrhea. Right sinus exhibits no maxillary sinus tenderness and no frontal sinus tenderness. Left sinus exhibits no maxillary sinus tenderness and no frontal sinus tenderness.  Mouth/Throat: Uvula  is midline, oropharynx is clear and moist and mucous membranes are normal.  Eyes: Conjunctivae, EOM and lids are normal. Pupils are equal, round, and reactive to light. Lids are everted and swept, no foreign bodies found.  Neck: Trachea normal and normal range of motion. Neck supple. Carotid bruit is not present. No thyroid mass and no thyromegaly present.  Cardiovascular: Normal rate, regular rhythm, S1 normal, S2 normal, normal heart sounds, intact distal pulses and normal pulses.  Exam reveals no gallop and no friction rub.   No murmur heard. Pulses:      Popliteal pulses are 2+ on the right side, and 2+ on the left side.       Dorsalis pedis pulses are 2+ on the right side, and 2+ on the left side.       Posterior tibial pulses are 2+ on the right side, and 2+ on the left side.  Pulmonary/Chest: Effort normal and breath sounds normal. No tachypnea. No respiratory distress. She has no decreased breath sounds. She has no wheezes. She has no rhonchi. She has no rales.  Abdominal: Soft. Normal appearance and bowel sounds are normal. There is no tenderness.  Neurological: She is alert.  Skin: Skin is warm, dry and intact. No rash noted.  Psychiatric: Her speech is normal and behavior is normal. Judgment and thought content normal. Her mood appears not anxious. Cognition and memory are normal. She does not exhibit a depressed mood.          Assessment & Plan:

## 2016-05-30 NOTE — Assessment & Plan Note (Signed)
Circulation intact and no sign of claudication per pt report.  Most likely neurogenic origin of symptoms... Will eval with labs including B12, TSH, doubt  sugar issue given A1C almost nomral in 01/2106.  Most likely intermittent nerve compression at posterior knee.  Discussed not crossing legs, changing sleep position and gardening position.

## 2016-06-09 ENCOUNTER — Other Ambulatory Visit (INDEPENDENT_AMBULATORY_CARE_PROVIDER_SITE_OTHER): Payer: BLUE CROSS/BLUE SHIELD

## 2016-06-09 ENCOUNTER — Other Ambulatory Visit (HOSPITAL_COMMUNITY)
Admission: RE | Admit: 2016-06-09 | Discharge: 2016-06-09 | Disposition: A | Discharge status: Home / Self Care | Payer: BLUE CROSS/BLUE SHIELD | Source: Ambulatory Visit | Attending: Primary Care | Admitting: Primary Care

## 2016-06-09 ENCOUNTER — Encounter: Payer: Self-pay | Admitting: Primary Care

## 2016-06-09 ENCOUNTER — Ambulatory Visit (INDEPENDENT_AMBULATORY_CARE_PROVIDER_SITE_OTHER): Payer: BLUE CROSS/BLUE SHIELD | Admitting: Primary Care

## 2016-06-09 VITALS — BP 110/78 | HR 74 | Temp 98.0°F | Ht 67.0 in | Wt 186.1 lb

## 2016-06-09 DIAGNOSIS — Z124 Encounter for screening for malignant neoplasm of cervix: Secondary | ICD-10-CM | POA: Diagnosis not present

## 2016-06-09 DIAGNOSIS — Z1231 Encounter for screening mammogram for malignant neoplasm of breast: Secondary | ICD-10-CM

## 2016-06-09 DIAGNOSIS — R7303 Prediabetes: Secondary | ICD-10-CM | POA: Diagnosis not present

## 2016-06-09 DIAGNOSIS — L74519 Primary focal hyperhidrosis, unspecified: Secondary | ICD-10-CM

## 2016-06-09 DIAGNOSIS — R748 Abnormal levels of other serum enzymes: Secondary | ICD-10-CM | POA: Diagnosis not present

## 2016-06-09 DIAGNOSIS — Z0001 Encounter for general adult medical examination with abnormal findings: Secondary | ICD-10-CM | POA: Insufficient documentation

## 2016-06-09 DIAGNOSIS — Z0000 Encounter for general adult medical examination without abnormal findings: Secondary | ICD-10-CM | POA: Diagnosis not present

## 2016-06-09 DIAGNOSIS — Z1239 Encounter for other screening for malignant neoplasm of breast: Secondary | ICD-10-CM

## 2016-06-09 DIAGNOSIS — R61 Generalized hyperhidrosis: Secondary | ICD-10-CM

## 2016-06-09 LAB — HEPATIC FUNCTION PANEL
ALBUMIN: 4.4 g/dL (ref 3.5–5.2)
ALK PHOS: 49 U/L (ref 39–117)
ALT: 12 U/L (ref 0–35)
AST: 14 U/L (ref 0–37)
Bilirubin, Direct: 0.1 mg/dL (ref 0.0–0.3)
Total Bilirubin: 0.5 mg/dL (ref 0.2–1.2)
Total Protein: 7 g/dL (ref 6.0–8.3)

## 2016-06-09 LAB — LIPID PANEL
Cholesterol: 189 mg/dL (ref 0–200)
HDL: 55.8 mg/dL (ref >39.00)
LDL Cholesterol: 120 mg/dL — ABNORMAL HIGH (ref 0–99)
NonHDL: 133.68
Total CHOL/HDL Ratio: 3
Triglycerides: 70 mg/dL (ref 0.0–149.0)
VLDL: 14 mg/dL (ref 0.0–40.0)

## 2016-06-09 LAB — HEMOGLOBIN A1C: Hgb A1c MFr Bld: 5.8 % (ref 4.6–6.5)

## 2016-06-09 NOTE — Assessment & Plan Note (Signed)
Will have her try Drysol. If no improvement then will send her to dermatology for further evaluation.
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
## 2016-06-09 NOTE — Addendum Note (Signed)
Addended by: Tawnya CrookSAMBATH, Emmagrace Runkel on: 06/09/2016 11:15 AM   Modules accepted: Orders

## 2016-06-09 NOTE — Patient Instructions (Addendum)
Try Drysol for excessive sweating.  Schedule a lab only appointment in the future to return for fasting labs. I will notify you of your results once received.   Start exercising. You should be getting 150 minutes of moderate intensity exercise weekly.  Continue to work on improving your diet. Increase vegetables, fruit, whole grains.  Call the [facility] to schedule your mammogram.  We will notify you once we receive your pap results.  Follow up in 1 year for your annual exam or sooner if needed.  It was a pleasure to see you today!

## 2016-06-09 NOTE — Assessment & Plan Note (Signed)
Check A1C. Discussed the importance of a healthy diet and regular exercise in order for weight loss, and to reduce the risk of other medical diseases.

## 2016-06-09 NOTE — Assessment & Plan Note (Signed)
Check LFTs today. 

## 2016-06-09 NOTE — Assessment & Plan Note (Signed)
Immunizations UTD. Pap due, completed today. Mammogram due, pending. Discussed the importance of a healthy diet and regular exercise in order for weight loss, and to reduce the risk of other medical diseases. Exam today unremarkable. Labs pending, she will return when fasting. Follow up in 1 year for annual exam.

## 2016-06-13 LAB — CYTOLOGY - PAP
Diagnosis: NEGATIVE
HPV: NOT DETECTED

## 2016-06-16 DIAGNOSIS — Z1231 Encounter for screening mammogram for malignant neoplasm of breast: Secondary | ICD-10-CM | POA: Diagnosis not present

## 2016-06-27 ENCOUNTER — Encounter: Payer: Self-pay | Admitting: Primary Care

## 2017-02-13 ENCOUNTER — Ambulatory Visit (INDEPENDENT_AMBULATORY_CARE_PROVIDER_SITE_OTHER): Payer: BLUE CROSS/BLUE SHIELD | Admitting: Family Medicine

## 2017-02-13 ENCOUNTER — Encounter: Payer: Self-pay | Admitting: Family Medicine

## 2017-02-13 ENCOUNTER — Ambulatory Visit: Payer: BLUE CROSS/BLUE SHIELD | Admitting: Internal Medicine

## 2017-02-13 VITALS — BP 122/76 | HR 79 | Temp 99.2°F | Wt 195.5 lb

## 2017-02-13 DIAGNOSIS — M419 Scoliosis, unspecified: Secondary | ICD-10-CM

## 2017-02-13 DIAGNOSIS — M546 Pain in thoracic spine: Secondary | ICD-10-CM

## 2017-02-13 MED ORDER — METHOCARBAMOL 500 MG PO TABS: 500.0000 mg | ORAL_TABLET | Freq: Two times a day (BID) | ORAL | 0 refills | Status: DC | PRN

## 2017-02-13 MED ORDER — NAPROXEN 500 MG PO TABS: ORAL_TABLET | ORAL | 0 refills | Status: DC

## 2017-02-13 NOTE — Patient Instructions (Signed)
I think you have rhomboid strain - do exercises provided today, treat with naprosyn anti inflammatory and muscle relaxant sent to pharmacy Let us know if not improving with treatment.

## 2017-02-13 NOTE — Progress Notes (Signed)
BP 122/76 (BP Location: Left Arm, Patient Position: Sitting, Cuff Size: Normal)   Pulse 79   Temp 99.2 F (37.3 C) (Oral)   Wt 195 lb 8 oz (88.7 kg)   LMP 01/20/2017 (LMP Unknown)   SpO2 98%   BMI 30.62 kg/m    CC: thoracic back pain Subjective:    Patient ID: Raven Bauer, female    DOB: 1970-03-11, 47 y.o.   MRN: [id number]  HPI: Raven Bauer is a 47 y.o. female presenting on 02/13/2017 for Back Pain (pain under left scapula, radiates up to left breast. started about a week ago. took pain med but no relief. interrupting sleep from pain. any movement makes it worsen. daughter tried to massage it out but no help.)   Here with daughter Raven Bauer.   1 wk h/o progressively worsening L thoracic back pain "under shoulder blade". sharp stabbing pain. Yesterday had trouble with deep breaths, yawning, cough. Radiation of pain to L lateral breast. Some radiation down L arm. Trouble sleeping due to pain. Denies inciting trauma/falls or injury. Has been lifting 4yo son more frequently. "Feels like pulled muscle". Also endorses some anterior chest wall discomfort.   Denies rash, fevers/chills, abd pain, dyspnea, no breast masses. UTD mammogram. No lower back pain.   Tried generic headache medicine which is starting to kick in so helpful.   Known scoliosis. Prior saw chiropractor.  H/o shingles 2002 - this feels different.  Relevant past medical, surgical, family and social history reviewed and updated as indicated. Interim medical history since our last visit reviewed. Allergies and medications reviewed and updated. Outpatient Medications Prior to Visit  Medication Sig Dispense Refill  . Iron TABS Take 1 tablet by mouth daily as needed. With orange juice on an empty stomach.      No facility-administered medications prior to visit.      Per HPI unless specifically indicated in ROS section below Review of Systems     Objective:    BP 122/76 (BP Location: Left Arm, Patient  Position: Sitting, Cuff Size: Normal)   Pulse 79   Temp 99.2 F (37.3 C) (Oral)   Wt 195 lb 8 oz (88.7 kg)   LMP 01/20/2017 (LMP Unknown)   SpO2 98%   BMI 30.62 kg/m   Wt Readings from Last 3 Encounters:  02/13/17 195 lb 8 oz (88.7 kg)  06/09/16 186 lb 1.9 oz (84.4 kg)  05/30/16 186 lb 8 oz (84.6 kg)    Physical Exam  Constitutional: She appears well-developed and well-nourished. No distress.  HENT:  Mouth/Throat: Oropharynx is clear and moist. No oropharyngeal exudate.  Cardiovascular: Normal rate, regular rhythm, normal heart sounds and intact distal pulses.  No murmur heard. Pulmonary/Chest: Effort normal and breath sounds normal. No respiratory distress. She has no wheezes. She has no rales.  Musculoskeletal: She exhibits no edema.  No pain midline cervical, thoracic, lumbar spine.  Tender to palpation at R trapezius belly, marked tenderness to palpation R rhomboid - reproducible pain No discomfort to palpation of sternum but she has reproducible tenderness at L 2nd intercostals FROM at shoulders  Neurological:  Neg spurling bilaterally  Skin: Skin is warm and dry. No rash noted.  Psychiatric: She has a normal mood and affect.  Nursing note and vitals reviewed.     Assessment & Plan:   Problem List Items Addressed This Visit    Acute left-sided thoracic back pain - Primary    Exam consistent with R rhomboid strain - anticipate after  repetitive lifting of 47 yo (new).  Rx naprosyn, robaxin, discussed topical rub, [facility].  Update if not improving with treatment.       Relevant Medications   naproxen (NAPROSYN) 500 MG tablet   methocarbamol (ROBAXIN) 500 MG tablet   Scoliosis deformity of spine       Follow up plan: Return if symptoms worsen or fail to improve.  Eustaquio BoydenJavier Keagan Anthis, MD

## 2017-02-13 NOTE — Assessment & Plan Note (Signed)
Exam consistent with R rhomboid strain - anticipate after repetitive lifting of 47 yo (new).  Rx naprosyn, robaxin, discussed topical rub, provided with [facility]omb[facility] strain [facility].  Update if not improving with treatment.

## 2017-10-14 DIAGNOSIS — J029 Acute pharyngitis, unspecified: Secondary | ICD-10-CM | POA: Diagnosis not present

## 2017-12-21 DIAGNOSIS — F4323 Adjustment disorder with mixed anxiety and depressed mood: Secondary | ICD-10-CM | POA: Diagnosis not present

## 2018-01-03 DIAGNOSIS — F4323 Adjustment disorder with mixed anxiety and depressed mood: Secondary | ICD-10-CM | POA: Diagnosis not present

## 2018-01-21 ENCOUNTER — Ambulatory Visit (INDEPENDENT_AMBULATORY_CARE_PROVIDER_SITE_OTHER): Payer: BLUE CROSS/BLUE SHIELD | Admitting: Family Medicine

## 2018-01-21 ENCOUNTER — Encounter: Payer: Self-pay | Admitting: Family Medicine

## 2018-01-21 ENCOUNTER — Encounter: Payer: Self-pay | Admitting: *Deleted

## 2018-01-21 VITALS — BP 118/78 | HR 88 | Temp 99.6°F | Ht 67.0 in | Wt 185.0 lb

## 2018-01-21 DIAGNOSIS — J069 Acute upper respiratory infection, unspecified: Secondary | ICD-10-CM | POA: Diagnosis not present

## 2018-01-21 DIAGNOSIS — B9789 Other viral agents as the cause of diseases classified elsewhere: Secondary | ICD-10-CM | POA: Diagnosis not present

## 2018-01-21 MED ORDER — BENZONATATE 100 MG PO CAPS: 100.0000 mg | ORAL_CAPSULE | Freq: Three times a day (TID) | ORAL | 0 refills | Status: DC | PRN

## 2018-01-21 MED ORDER — ALBUTEROL SULFATE HFA 108 (90 BASE) MCG/ACT IN AERS: 2.0000 | INHALATION_SPRAY | RESPIRATORY_TRACT | 1 refills | Status: DC | PRN
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
## 2018-01-21 NOTE — Patient Instructions (Signed)
Good to see you today  I think you have a viral upper respiratory illness  Can continue over the counter medication and I have sent in an inhaler and cough pill to you pharmacy  For fever, muscle aches and sore throat, can take ibuprofen 2-3 tablets every 8 to 12 hours as needed  Drink enough liquids to keep your urine light yellow  Let me know if not improved in 3-4 days

## 2018-01-23 ENCOUNTER — Encounter: Payer: Self-pay | Admitting: Family Medicine

## 2018-06-02 ENCOUNTER — Encounter (HOSPITAL_COMMUNITY): Payer: Self-pay

## 2018-06-02 ENCOUNTER — Other Ambulatory Visit: Payer: Self-pay

## 2018-06-02 ENCOUNTER — Ambulatory Visit (HOSPITAL_COMMUNITY)
Admission: EM | Admit: 2018-06-02 | Discharge: 2018-06-02 | Disposition: A | Discharge status: Home / Self Care | Payer: BLUE CROSS/BLUE SHIELD | Attending: Urgent Care | Admitting: Urgent Care

## 2018-06-02 DIAGNOSIS — G43809 Other migraine, not intractable, without status migrainosus: Secondary | ICD-10-CM

## 2018-06-02 DIAGNOSIS — H1032 Unspecified acute conjunctivitis, left eye: Secondary | ICD-10-CM

## 2018-06-02 MED ORDER — ERYTHROMYCIN 5 MG/GM OP OINT: TOPICAL_OINTMENT | Freq: Four times a day (QID) | OPHTHALMIC | 0 refills | Status: DC
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
## 2018-06-02 NOTE — ED Triage Notes (Signed)
Pt cc she has a film over her left eye and a headache this started today.

## 2018-06-03 ENCOUNTER — Ambulatory Visit: Payer: BLUE CROSS/BLUE SHIELD | Admitting: Primary Care

## 2018-06-03 ENCOUNTER — Encounter: Payer: Self-pay | Admitting: Primary Care

## 2018-06-03 ENCOUNTER — Telehealth: Payer: Self-pay

## 2018-06-03 ENCOUNTER — Ambulatory Visit (INDEPENDENT_AMBULATORY_CARE_PROVIDER_SITE_OTHER): Payer: BLUE CROSS/BLUE SHIELD | Admitting: Primary Care

## 2018-06-03 VITALS — BP 148/80 | Temp 99.1°F | Wt 185.0 lb

## 2018-06-03 DIAGNOSIS — G43909 Migraine, unspecified, not intractable, without status migrainosus: Secondary | ICD-10-CM | POA: Diagnosis not present

## 2018-06-03 MED ORDER — SUMATRIPTAN SUCCINATE 25 MG PO TABS: ORAL_TABLET | ORAL | 0 refills | Status: DC

## 2018-06-03 NOTE — Patient Instructions (Signed)
Take the sumatriptan 25 mg tablet once. You may repeat with a second tablet 2 hours later if migraine persists.   Please call me tomorrow with an update.  It was a pleasure to see you today! Mayra Reel, NP-C

## 2018-06-03 NOTE — Telephone Encounter (Signed)
Patient seen in the office to evaluate today for symptoms.   Please refer to 06/03/18 ov encounter.

## 2018-06-03 NOTE — Assessment & Plan Note (Signed)
  Symptoms suspicious for migraine.  Given that she's had temporary relief with Tylenol we will proceed with more aggressive treatment.  We discussed treating with Excedrin, IM Toradol in our office, or sumatriptan. She opts for sumatriptan. Rx for Sumatriptan 25 mg sent to pharmacy, explained directions for use. She will call us tomorrow with an update.

## 2018-06-03 NOTE — Telephone Encounter (Signed)
[facility] Night - Client TELEPHONE ADVICE RECORD [facility] Patient Name: Raven Bauer Gender: Female DOB: 08-28-1970 Age: 48 Y 9 M 19 D Return Phone Number: [phone] (Primary) Address[place] [address] Client [facility] Night - Client Client Site  [facility] [place] - Night Physician Vernona Rieger - NP Contact Type Call Who Is Calling Patient / Member / Family / Caregiver Call Type Triage / Clinical Relationship To Patient Self Return Phone Number [phone] (Primary) Chief Complaint Headache Reason for Call Symptomatic / Request for Health Information Initial Comment Caller states that she woke up with a left sided strong headache. Her left eye is cloudy. Translation No Nurse Assessment Nurse: Elroy Channel, RN, Rosey Bath Date/Time Lamount Cohen Time): 06/02/2018 10:17:55 AM Confirm and document reason for call. If symptomatic, describe symptoms. ---Caller states that she has a left side of head. Vision is cloudy in left eye. Has taken Tylenol and some relief Has the patient had close contact with a person known or suspected to have the novel coronavirus illness OR traveled / lives in area with major community spread (including international travel) in the last 14 days from the onset of symptoms? * If Asymptomatic, screen for exposure and travel within the last 14 days. ---No Does the patient have any new or worsening symptoms? ---Yes Will a triage be completed? ---Yes Related visit to physician within the last 2 weeks? ---No Does the PT have any chronic conditions? (i.e. diabetes, asthma, this includes High risk factors for pregnancy, etc.) ---No Is the patient pregnant or possibly pregnant? (Ask all females between the ages of 60-55) ---No Is this a behavioral health or substance abuse call? ---No Guidelines Guideline Title Affirmed Question Affirmed Notes Nurse Date/Time  (Eastern Time) Eye Pain [1] Eye pain/discomfort AND [2] more than mild Elroy Channel, RN, Rosey Bath 06/02/2018 10:25:24 AM Disp. Time Lamount Cohen Time) Disposition Final User 06/02/2018 10:28:09 AM See HCP within 4 Hours (or PCP triage) Yes Elroy Channel, RN, Rosey Bath PLEASE NOTE: All timestamps contained within this report are represented as [place] Standard Time. CONFIDENTIALTY NOTICE: This fax transmission is intended only for the addressee. It contains information that is legally privileged, confidential or otherwise protected from use or disclosure. If you are not the intended recipient, you are strictly prohibited from reviewing, disclosing, copying using or disseminating any of this information or taking any action in reliance on or regarding this information. If you have received this fax in error, please notify us immediately by telephone so that we can arrange for its return to [place]. Phone: [phone], Toll-Free: [phone], Fax: [phone] Page: 2 of 2 Call Id: [id number] Caller Disagree/Comply Comply Caller Understands Yes PreDisposition Call a family member Care Advice Given Per Guideline SEE HCP WITHIN 4 HOURS (OR PCP TRIAGE): AVOID RUBBING: * Do not rub your eyes. PAIN MEDICINES: * For pain relief, take acetaminophen, ibuprofen, or naproxen. CALL BACK IF: * You become worse. CARE ADVICE given per Eye Pain (Adult) guideline. Referrals [facility] [place] [facility]
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
## 2018-06-04 ENCOUNTER — Telehealth: Payer: Self-pay | Admitting: Primary Care

## 2018-06-04 NOTE — Telephone Encounter (Signed)
There is a telephone encounter regarding this....

## 2018-06-04 NOTE — Telephone Encounter (Signed)
Spoken and notified patient of Raven Bauer comments. Patient verbalized understanding.  OV on 06/05/2018 at 9 am

## 2018-06-04 NOTE — Telephone Encounter (Signed)
Noted, I received her my chart message and responded.  Can we get her in the office for a visit for May 20th? I can write her out of work for this week until we get this figured out.

## 2018-06-04 NOTE — Telephone Encounter (Signed)
Patient also sent a message in MyChart giving more details . She is concern since She is suppose to go to work tomorrow.

## 2018-06-04 NOTE — Telephone Encounter (Signed)
Best number [phone] Pt called regarding the headaches she was having.  The med you gave yesterday helped her sleep but when she woke up this morning it was a lighter headache until she started moving around the pain came back to left side

## 2018-06-05 ENCOUNTER — Other Ambulatory Visit: Payer: Self-pay

## 2018-06-05 ENCOUNTER — Encounter: Payer: Self-pay | Admitting: Primary Care

## 2018-06-05 ENCOUNTER — Ambulatory Visit (INDEPENDENT_AMBULATORY_CARE_PROVIDER_SITE_OTHER): Payer: BLUE CROSS/BLUE SHIELD | Admitting: Primary Care

## 2018-06-05 DIAGNOSIS — G43909 Migraine, unspecified, not intractable, without status migrainosus: Secondary | ICD-10-CM

## 2018-06-05 DIAGNOSIS — R519 Headache, unspecified: Secondary | ICD-10-CM | POA: Insufficient documentation

## 2018-06-05 DIAGNOSIS — G4452 New daily persistent headache (NDPH): Secondary | ICD-10-CM

## 2018-06-05 MED ORDER — KETOROLAC TROMETHAMINE 60 MG/2ML IM SOLN
60.0000 mg | Freq: Once | INTRAMUSCULAR | Status: AC
  Administered 2018-06-05: 09:00:00 60 mg via INTRAMUSCULAR
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
## 2018-06-05 NOTE — Patient Instructions (Signed)
Stop by the front desk and speak with either [facility] regarding your MRI.  If your migraine returns you may try sumatriptan 50 mg, may repeat in 2 hours if needed.  Please also call me if your migraine returns.  It was a pleasure to see you today!

## 2018-06-05 NOTE — Assessment & Plan Note (Signed)
Abated last night, now with residual headache to right frontal and temporal region.   Given family history of early age stroke with migraines and new daily persistent headaches with blurred vision, will send for MRI to rule out any cause.   IM Toradol provided today. Discussed that if migraine returned she could try sumatriptan 50 mg at once and also to notify me.   Today she appears stable, neuro exam unremarkable.

## 2018-06-05 NOTE — Addendum Note (Signed)
Addended by: Tawnya Crook on: 06/05/2018 10:51 AM   Modules accepted: Orders

## 2018-06-05 NOTE — Assessment & Plan Note (Signed)
Never occurred before. Family history of stroke and migraines in mid 13's.  MRI pending. IM Toradol provided today. She will update.

## 2018-06-15 ENCOUNTER — Ambulatory Visit
Admission: RE | Admit: 2018-06-15 | Discharge: 2018-06-15 | Disposition: A | Discharge status: Home / Self Care | Payer: BLUE CROSS/BLUE SHIELD | Source: Ambulatory Visit | Attending: Primary Care | Admitting: Primary Care

## 2018-06-15 ENCOUNTER — Other Ambulatory Visit: Payer: Self-pay

## 2018-06-15 DIAGNOSIS — R51 Headache: Secondary | ICD-10-CM | POA: Diagnosis not present

## 2018-06-15 DIAGNOSIS — H538 Other visual disturbances: Secondary | ICD-10-CM | POA: Diagnosis not present

## 2018-06-15 DIAGNOSIS — G4452 New daily persistent headache (NDPH): Secondary | ICD-10-CM

## 2018-06-15 MED ORDER — GADOBENATE DIMEGLUMINE 529 MG/ML IV SOLN
17.0000 mL | Freq: Once | INTRAVENOUS | Status: AC | PRN
  Administered 2018-06-15: 17 mL via INTRAVENOUS

## 2018-06-26 ENCOUNTER — Other Ambulatory Visit: Payer: Self-pay | Admitting: Primary Care

## 2018-06-26 DIAGNOSIS — G43909 Migraine, unspecified, not intractable, without status migrainosus: Secondary | ICD-10-CM

## 2018-06-26 NOTE — Telephone Encounter (Signed)
Last prescribed on 06/03/2018 . Last appointment on 06/05/2018. No future appointment

## 2018-06-26 NOTE — Telephone Encounter (Signed)
See my chart message

## 2018-08-01 ENCOUNTER — Encounter: Payer: Self-pay | Admitting: Primary Care

## 2018-10-28 ENCOUNTER — Telehealth: Payer: Self-pay

## 2018-10-28 NOTE — Telephone Encounter (Signed)
Pt calling and for 1 wk pt having fatigue, and  discomfort in muscle in lt shoulder,neck and arm. No CP. Since 10/26/18 pt has H/A with pain level today from 4 - 7.no other covid symptoms. Pt said she has a good tolerance of pain. Pt has been taking Generic Tylenol 500 mg taking 2 tabs daily. 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 Pt wonders if she should be covid tested. Pt will go to [facility] in [place] between 8 AM and 3:30 pm on 10/29/18 for testing and will self quarantine until gets negative covid result. FYI to Gentry Fitz NP.

## 2018-10-29 ENCOUNTER — Other Ambulatory Visit: Payer: Self-pay

## 2018-10-29 DIAGNOSIS — Z20822 Contact with and (suspected) exposure to covid-19: Secondary | ICD-10-CM

## 2018-10-29 NOTE — Telephone Encounter (Signed)
Noted and agree to testing. 

## 2018-10-30 LAB — NOVEL CORONAVIRUS, NAA: SARS-CoV-2, NAA: NOT DETECTED

## 2019-02-27 ENCOUNTER — Other Ambulatory Visit: Payer: Self-pay | Admitting: Primary Care

## 2019-02-27 DIAGNOSIS — R748 Abnormal levels of other serum enzymes: Secondary | ICD-10-CM

## 2019-02-27 DIAGNOSIS — D696 Thrombocytopenia, unspecified: Secondary | ICD-10-CM

## 2019-02-27 DIAGNOSIS — R7303 Prediabetes: Secondary | ICD-10-CM

## 2019-03-07 ENCOUNTER — Telehealth: Payer: Self-pay

## 2019-03-07 NOTE — Telephone Encounter (Signed)
LVM w COVID screen, front door and back lab 2.19.2021 TLJ  

## 2019-03-12 ENCOUNTER — Other Ambulatory Visit (INDEPENDENT_AMBULATORY_CARE_PROVIDER_SITE_OTHER): Payer: BC Managed Care – PPO

## 2019-03-12 ENCOUNTER — Other Ambulatory Visit: Payer: Self-pay

## 2019-03-12 DIAGNOSIS — R7303 Prediabetes: Secondary | ICD-10-CM | POA: Diagnosis not present

## 2019-03-12 DIAGNOSIS — D696 Thrombocytopenia, unspecified: Secondary | ICD-10-CM | POA: Diagnosis not present

## 2019-03-12 DIAGNOSIS — R748 Abnormal levels of other serum enzymes: Secondary | ICD-10-CM

## 2019-03-12 LAB — LIPID PANEL
Cholesterol: 190 mg/dL (ref 0–200)
HDL: 42.9 mg/dL (ref >39.00)
LDL Cholesterol: 129 mg/dL — ABNORMAL HIGH (ref 0–99)
NonHDL: 146.97
Total CHOL/HDL Ratio: 4
Triglycerides: 92 mg/dL (ref 0.0–149.0)
VLDL: 18.4 mg/dL (ref 0.0–40.0)

## 2019-03-12 LAB — COMPREHENSIVE METABOLIC PANEL
ALT: 16 U/L (ref 0–35)
AST: 16 U/L (ref 0–37)
Albumin: 4.1 g/dL (ref 3.5–5.2)
Alkaline Phosphatase: 56 U/L (ref 39–117)
BUN: 6 mg/dL (ref 6–23)
CO2: 25 mEq/L (ref 19–32)
Calcium: 9 mg/dL (ref 8.4–10.5)
Chloride: 106 mEq/L (ref 96–112)
Creatinine, Ser: 0.77 mg/dL (ref 0.40–1.20)
GFR: 97.4 mL/min (ref >60.00)
Glucose, Bld: 100 mg/dL — ABNORMAL HIGH (ref 70–99)
Potassium: 4.4 mEq/L (ref 3.5–5.1)
Sodium: 138 mEq/L (ref 135–145)
Total Bilirubin: 0.6 mg/dL (ref 0.2–1.2)
Total Protein: 7.2 g/dL (ref 6.0–8.3)

## 2019-03-12 LAB — CBC
HCT: 36.4 % (ref 36.0–46.0)
Hemoglobin: 12 g/dL (ref 12.0–15.0)
MCHC: 33.1 g/dL (ref 30.0–36.0)
MCV: 91.2 fl (ref 78.0–100.0)
Platelets: 420 10*3/uL — ABNORMAL HIGH (ref 150.0–400.0)
RBC: 3.99 Mil/uL (ref 3.87–5.11)
RDW: 12.9 % (ref 11.5–15.5)
WBC: 4.9 10*3/uL (ref 4.0–10.5)

## 2019-03-13 LAB — HEMOGLOBIN A1C
Hgb A1c MFr Bld: 5.6 % of total Hgb (ref <5.7)
Mean Plasma Glucose: 114 (calc)
eAG (mmol/L): 6.3 (calc)

## 2019-03-18 ENCOUNTER — Encounter: Payer: Self-pay | Admitting: Primary Care

## 2019-03-18 ENCOUNTER — Other Ambulatory Visit: Payer: Self-pay

## 2019-03-18 ENCOUNTER — Ambulatory Visit: Payer: BC Managed Care – PPO | Admitting: Primary Care

## 2019-03-18 VITALS — BP 116/72 | HR 70 | Temp 96.9°F | Ht 67.0 in | Wt 216.5 lb

## 2019-03-18 DIAGNOSIS — Z0000 Encounter for general adult medical examination without abnormal findings: Secondary | ICD-10-CM | POA: Diagnosis not present

## 2019-03-18 DIAGNOSIS — M25519 Pain in unspecified shoulder: Secondary | ICD-10-CM | POA: Insufficient documentation

## 2019-03-18 DIAGNOSIS — D696 Thrombocytopenia, unspecified: Secondary | ICD-10-CM

## 2019-03-18 DIAGNOSIS — G43909 Migraine, unspecified, not intractable, without status migrainosus: Secondary | ICD-10-CM | POA: Diagnosis not present

## 2019-03-18 DIAGNOSIS — R748 Abnormal levels of other serum enzymes: Secondary | ICD-10-CM

## 2019-03-18 DIAGNOSIS — R61 Generalized hyperhidrosis: Secondary | ICD-10-CM | POA: Diagnosis not present

## 2019-03-18 DIAGNOSIS — Z1211 Encounter for screening for malignant neoplasm of colon: Secondary | ICD-10-CM

## 2019-03-18 DIAGNOSIS — M25512 Pain in left shoulder: Secondary | ICD-10-CM

## 2019-03-18 DIAGNOSIS — R7303 Prediabetes: Secondary | ICD-10-CM

## 2019-03-18 NOTE — Assessment & Plan Note (Signed)
Chronic and stable, tolerable. She will update if anything changes. Continue to monitor.
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
## 2019-03-18 NOTE — Assessment & Plan Note (Signed)
Normalized and now out of prediabetic range with A1C of 5.6. Continue to monitor.

## 2019-03-18 NOTE — Assessment & Plan Note (Signed)
Tetanus UTD, declines influenza vaccination. Pap smear UTD, due in 2022. Colonoscopy due, referral placed to GI. Encouraged a healthy diet, regular exercise.  Exam today as noted. Labs reviewed.

## 2019-03-18 NOTE — Assessment & Plan Note (Signed)
Normal on recent labs.

## 2019-03-18 NOTE — Patient Instructions (Addendum)
Start Ibuprofen 600 mg every 8-12 hours for one week.  Please schedule a visit with Dr. Lorelei Pont if needed.  Continue exercising. You should be getting 150 minutes of moderate intensity exercise weekly.  Continue to work on a healthy diet. Ensure you are consuming 64 ounces of water daily.  You will be contacted regarding your referral to GI for the colonoscopy.  Please let us know if you have not been contacted within two weeks.   It was a pleasure to see you today!   Preventive Care 38-47 Years Old, Female Preventive care refers to visits with your health care provider and lifestyle choices that can promote health and wellness. This includes:  A yearly physical exam. This may also be called an annual well check.  Regular dental visits and eye exams.  Immunizations.  Screening for certain conditions.  Healthy lifestyle choices, such as eating a healthy diet, getting regular exercise, not using drugs or products that contain nicotine and tobacco, and limiting alcohol use. What can I expect for my preventive care visit? Physical exam Your health care provider will check your:  Height and weight. This may be used to calculate body mass index (BMI), which tells if you are at a healthy weight.  Heart rate and blood pressure.  Skin for abnormal spots. Counseling Your health care provider may ask you questions about your:  Alcohol, tobacco, and drug use.  Emotional well-being.  Home and relationship well-being.  Sexual activity.  Eating habits.  Work and work Statistician.  Method of birth control.  Menstrual cycle.  Pregnancy history. What immunizations do I need?  Influenza (flu) vaccine  This is recommended every year. Tetanus, diphtheria, and pertussis (Tdap) vaccine  You may need a Td booster every 10 years. Varicella (chickenpox) vaccine  You may need this if you have not been vaccinated. Zoster (shingles) vaccine  You may need this after age  76. Measles, mumps, and rubella (MMR) vaccine  You may need at least one dose of MMR if you were born in 1957 or later. You may also need a second dose. Pneumococcal conjugate (PCV13) vaccine  You may need this if you have certain conditions and were not previously vaccinated. Pneumococcal polysaccharide (PPSV23) vaccine  You may need one or two doses if you smoke cigarettes or if you have certain conditions. Meningococcal conjugate (MenACWY) vaccine  You may need this if you have certain conditions. Hepatitis A vaccine  You may need this if you have certain conditions or if you travel or work in places where you may be exposed to hepatitis A. Hepatitis B vaccine  You may need this if you have certain conditions or if you travel or work in places where you may be exposed to hepatitis B. Haemophilus influenzae type b (Hib) vaccine  You may need this if you have certain conditions. Human papillomavirus (HPV) vaccine  If recommended by your health care provider, you may need three doses over 6 months. You may receive vaccines as individual doses or as more than one vaccine together in one shot (combination vaccines). Talk with your health care provider about the risks and benefits of combination vaccines. What tests do I need? Blood tests  Lipid and cholesterol levels. These may be checked every 5 years, or more frequently if you are over 24 years old.  Hepatitis C test.  Hepatitis B test. Screening  Lung cancer screening. You may have this screening every year starting at age 66 if you have a 30-pack-year history  of smoking and currently smoke or have quit within the past 15 years.  Colorectal cancer screening. All adults should have this screening starting at age 50 and continuing until age 49. Your health care provider may recommend screening at age 36 if you are at increased risk. You will have tests every 1-10 years, depending on your results and the type of screening  test.  Diabetes screening. This is done by checking your blood sugar (glucose) after you have not eaten for a while (fasting). You may have this done every 1-3 years.  Mammogram. This may be done every 1-2 years. Talk with your health care provider about when you should start having regular mammograms. This may depend on whether you have a family history of breast cancer.  BRCA-related cancer screening. This may be done if you have a family history of breast, ovarian, tubal, or peritoneal cancers.  Pelvic exam and Pap test. This may be done every 3 years starting at age 56. Starting at age 4, this may be done every 5 years if you have a Pap test in combination with an HPV test. Other tests  Sexually transmitted disease (STD) testing.  Bone density scan. This is done to screen for osteoporosis. You may have this scan if you are at high risk for osteoporosis. Follow these instructions at home: Eating and drinking  Eat a diet that includes fresh fruits and vegetables, whole grains, lean protein, and low-fat dairy.  Take vitamin and mineral supplements as recommended by your health care provider.  Do not drink alcohol if: ? Your health care provider tells you not to drink. ? You are pregnant, may be pregnant, or are planning to become pregnant.  If you drink alcohol: ? Limit how much you have to 0-1 drink a day. ? Be aware of how much alcohol is in your drink. In the [place] one drink equals one 12 oz bottle of beer (355 mL), one 5 oz glass of wine (148 mL), or one 1 oz glass of hard liquor (44 mL). Lifestyle  Take daily care of your teeth and gums.  Stay active. Exercise for at least 30 minutes on 5 or more days each week.  Do not use any products that contain nicotine or tobacco, such as cigarettes, e-cigarettes, and chewing tobacco. If you need help quitting, ask your health care provider.  If you are sexually active, practice safe sex. Use a condom or other form of birth control  (contraception) in order to prevent pregnancy and STIs (sexually transmitted infections).  If told by your health care provider, take low-dose aspirin daily starting at age 49. What's next?  Visit your health care provider once a year for a well check visit.  Ask your health care provider how often you should have your eyes and teeth checked.  Stay up to date on all vaccines. This information is not intended to replace advice given to you by your health care provider. Make sure you discuss any questions you have with your health care provider. Document Revised: 09/13/2017 Document Reviewed: 09/13/2017 Elsevier Patient Education  2020 [employer].

## 2019-03-18 NOTE — Assessment & Plan Note (Signed)
Acute for 1-2 months with radiculopathy down left upper extremity. Since she's not done anything for treatment we will start with a course of OTC NSAID's.   She may ultimately require an injection of steroids. She will schedule a visit with Sports Medicine if no improvement after conservative treatment. ROM and strength normal.

## 2019-03-18 NOTE — Assessment & Plan Note (Signed)
No recent migraines. She does have frequent headaches that are overall tolerable. Discussed the need for preventative treatment if headaches become intolerable. She agrees and will update.

## 2019-03-18 NOTE — Assessment & Plan Note (Signed)
Recent CBC with slight elevation of platelets, not concerning. No anemia. Continue to monitor.

## 2019-03-27 ENCOUNTER — Other Ambulatory Visit: Payer: Self-pay

## 2019-03-27 ENCOUNTER — Telehealth: Payer: Self-pay

## 2019-03-27 DIAGNOSIS — Z1211 Encounter for screening for malignant neoplasm of colon: Secondary | ICD-10-CM

## 2019-03-27 DIAGNOSIS — Z800 Family history of malignant neoplasm of digestive organs: Secondary | ICD-10-CM

## 2019-03-27 NOTE — Telephone Encounter (Signed)
Gastroenterology Pre-Procedure Review  Request Date: Thursday 04/17/19 Requesting Physician: Dr. Tobi Bastos  PATIENT REVIEW QUESTIONS: The patient responded to the following health history questions as indicated:    1. Are you having any GI issues? no 2. Do you have a personal history of Polyps? no 3. Do you have a family history of Colon Cancer or Polyps? yes (paternal grandfather colon cancer) 4. Diabetes Mellitus? no 5. Joint replacements in the past 12 months?no 6. Major health problems in the past 3 months?no 7. Any artificial heart valves, MVP, or defibrillator?no    MEDICATIONS & ALLERGIES:    Patient reports the following regarding taking any anticoagulation/antiplatelet therapy:   Plavix, Coumadin, Eliquis, Xarelto, Lovenox, Pradaxa, Brilinta, or Effient? no Aspirin? no  Patient confirms/reports the following medications:  Current Outpatient Medications  Medication Sig Dispense Refill  . Iron TABS Take 1 tablet by mouth daily as needed. With orange juice on an empty stomach.     . SUMAtriptan (IMITREX) 25 MG tablet Take 1 tablet by mouth at migraine onset, may repeat in 2 hours if headache persists or recurs. Do not exceed 2 tablets in 24 hours. 10 tablet 0   No current facility-administered medications for this visit.    Patient confirms/reports the following allergies:  Allergies  Allergen Reactions  . Penicillins Rash    Pt states,"told by mom she breaks out when taken". Pt states,"told by mom she breaks out when taken".    No orders of the defined types were placed in this encounter.   AUTHORIZATION INFORMATION Primary Insurance: 1D#: Group #:  Secondary Insurance: 1D#: Group #:  SCHEDULE INFORMATION: Date: Thursday 04/17/19 Time: Location:[facility]

## 2019-04-15 ENCOUNTER — Other Ambulatory Visit: Payer: Self-pay

## 2019-04-15 ENCOUNTER — Other Ambulatory Visit
Admission: RE | Admit: 2019-04-15 | Discharge: 2019-04-15 | Disposition: A | Discharge status: Home / Self Care | Payer: BC Managed Care – PPO | Source: Ambulatory Visit | Attending: Gastroenterology | Admitting: Gastroenterology

## 2019-04-15 DIAGNOSIS — Z20822 Contact with and (suspected) exposure to covid-19: Secondary | ICD-10-CM | POA: Diagnosis not present

## 2019-04-15 DIAGNOSIS — Z01812 Encounter for preprocedural laboratory examination: Secondary | ICD-10-CM | POA: Insufficient documentation

## 2019-04-15 LAB — SARS CORONAVIRUS 2 (TAT 6-24 HRS): SARS Coronavirus 2: NEGATIVE

## 2019-04-17 ENCOUNTER — Ambulatory Visit: Payer: BC Managed Care – PPO | Admitting: Anesthesiology

## 2019-04-17 ENCOUNTER — Ambulatory Visit
Admission: RE | Admit: 2019-04-17 | Discharge: 2019-04-17 | Disposition: A | Discharge status: Home / Self Care | Payer: BC Managed Care – PPO | Attending: Gastroenterology | Admitting: Gastroenterology

## 2019-04-17 ENCOUNTER — Encounter
Admission: RE | Disposition: A | Discharge status: Home / Self Care | Payer: Self-pay | Source: Home / Self Care | Attending: Gastroenterology

## 2019-04-17 ENCOUNTER — Other Ambulatory Visit: Payer: Self-pay

## 2019-04-17 ENCOUNTER — Encounter: Payer: Self-pay | Admitting: Gastroenterology

## 2019-04-17 DIAGNOSIS — Z79899 Other long term (current) drug therapy: Secondary | ICD-10-CM | POA: Diagnosis not present

## 2019-04-17 DIAGNOSIS — Z1211 Encounter for screening for malignant neoplasm of colon: Secondary | ICD-10-CM | POA: Diagnosis not present

## 2019-04-17 DIAGNOSIS — G43909 Migraine, unspecified, not intractable, without status migrainosus: Secondary | ICD-10-CM | POA: Diagnosis not present

## 2019-04-17 HISTORY — PX: COLONOSCOPY WITH PROPOFOL: SHX5780

## 2019-04-17 LAB — POCT PREGNANCY, URINE: Preg Test, Ur: NEGATIVE

## 2019-04-17 SURGERY — COLONOSCOPY WITH PROPOFOL
Anesthesia: General

## 2019-04-17 MED ORDER — LIDOCAINE HCL (CARDIAC) PF 100 MG/5ML IV SOSY
PREFILLED_SYRINGE | INTRAVENOUS | Status: DC | PRN
  Administered 2019-04-17: 50 mg via INTRAVENOUS

## 2019-04-17 MED ORDER — SODIUM CHLORIDE 0.9 % IV SOLN: INTRAVENOUS | Status: DC

## 2019-04-17 MED ORDER — GLYCOPYRROLATE 0.2 MG/ML IJ SOLN
INTRAMUSCULAR | Status: AC
  Filled 2019-04-17: qty 1

## 2019-04-17 MED ORDER — PROPOFOL 10 MG/ML IV BOLUS
INTRAVENOUS | Status: DC | PRN
  Administered 2019-04-17: 70 mg via INTRAVENOUS

## 2019-04-17 MED ORDER — LIDOCAINE HCL (PF) 2 % IJ SOLN
INTRAMUSCULAR | Status: AC
  Filled 2019-04-17: qty 5

## 2019-04-17 MED ORDER — PROPOFOL 500 MG/50ML IV EMUL
INTRAVENOUS | Status: AC
  Filled 2019-04-17: qty 50

## 2019-04-17 MED ORDER — PROPOFOL 500 MG/50ML IV EMUL
INTRAVENOUS | Status: DC | PRN
  Administered 2019-04-17: 175 ug/kg/min via INTRAVENOUS

## 2019-04-17 MED ORDER — PHENYLEPHRINE HCL (PRESSORS) 10 MG/ML IV SOLN
INTRAVENOUS | Status: AC
  Filled 2019-04-17: qty 1

## 2019-04-17 NOTE — Anesthesia Procedure Notes (Signed)
Date/Time: 04/17/2019 8:18 AM Performed by: Ginger Carne, CRNA Pre-anesthesia Checklist: Patient identified, Emergency Drugs available, Suction available, Patient being monitored and Timeout performed Patient Re-evaluated:Patient Re-evaluated prior to induction Oxygen Delivery Method: Nasal cannula Preoxygenation: Pre-oxygenation with 100% oxygen Induction Type: IV induction
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
## 2019-04-17 NOTE — Transfer of Care (Signed)
Immediate Anesthesia Transfer of Care Note  Patient: Raven Bauer  Procedure(s) Performed: COLONOSCOPY WITH PROPOFOL (N/A )  Patient Location: PACU  Anesthesia Type:General  Level of Consciousness: sedated  Airway & Oxygen Therapy: Patient Spontanous Breathing  Post-op Assessment: Report given to RN and Post -op Vital signs reviewed and stable  Post vital signs: Reviewed and stable  Last Vitals:  Vitals Value Taken Time  BP 87/67 04/17/19 0840  Temp    Pulse 81 04/17/19 0840  Resp 15 04/17/19 0840  SpO2 97 % 04/17/19 0840    Last Pain:  Vitals:   04/17/19 0706  TempSrc: Temporal  PainSc: 0-No pain         Complications: No apparent anesthesia complications

## 2019-04-17 NOTE — Anesthesia Postprocedure Evaluation (Signed)
Anesthesia Post Note  Patient: Raven Bauer  Procedure(s) Performed: COLONOSCOPY WITH PROPOFOL (N/A )  Patient location during evaluation: Endoscopy Anesthesia Type: General Level of consciousness: awake and alert Pain management: pain level controlled Vital Signs Assessment: post-procedure vital signs reviewed and stable Respiratory status: spontaneous breathing, nonlabored ventilation and respiratory function stable Cardiovascular status: blood pressure returned to baseline and stable Postop Assessment: no apparent nausea or vomiting Anesthetic complications: no     Last Vitals:  Vitals:   04/17/19 0858 04/17/19 0908  BP: 113/84 114/80  Pulse: 60 (!) 53  Resp: 14 12  Temp:    SpO2: 100% 100%    Last Pain:  Vitals:   04/17/19 0908  TempSrc:   PainSc: 0-No pain                 Christia Reading

## 2019-04-17 NOTE — Op Note (Signed)
[facility] Gastroenterology Patient Name: Raven Bauer Procedure Date: 04/17/2019 8:14 AM MRN: [id number] Account #: [id number] Date of Birth: [birth year] Admit Type: Outpatient Age: 49 Room: [facility] ENDO ROOM 1 Gender: Female Note Status: Finalized Procedure:             Colonoscopy Indications:           Screening for colorectal malignant neoplasm Providers:             Wyline Mood MD, MD Referring MD:          Doreene Nest (Referring MD) Medicines:             Monitored Anesthesia Care Complications:         No immediate complications. Procedure:             Pre-Anesthesia Assessment:                        - Prior to the procedure, a History and Physical was                         performed, and patient medications, allergies and                         sensitivities were reviewed. The patient's tolerance                         of previous anesthesia was reviewed.                        - The risks and benefits of the procedure and the                         sedation options and risks were discussed with the                         patient. All questions were answered and informed                         consent was obtained.                        - ASA Grade Assessment: II - A patient with mild                         systemic disease.                        After obtaining informed consent, the colonoscope was                         passed under direct vision. Throughout the procedure,                         the patient's blood pressure, pulse, and oxygen                         saturations were monitored continuously. The                         Colonoscope was introduced through the anus and  advanced to the the cecum, identified by the                         appendiceal orifice. The colonoscopy was performed                         with ease. The patient tolerated the procedure well.                         The quality of  the bowel preparation was excellent. Findings:      The entire examined colon appeared normal on direct and retroflexion       views. Impression:            - The entire examined colon is normal on direct and                         retroflexion views.                        - No specimens collected. Recommendation:        - Discharge patient to home (with escort).                        - Resume previous diet.                        - Continue present medications.                        - Repeat colonoscopy in 10 years for screening                         purposes. Procedure Code(s):     --- Professional ---                        [phone], Colonoscopy, flexible; diagnostic, including                         collection of specimen(s) by brushing or washing, when                         performed (separate procedure) Diagnosis Code(s):     --- Professional ---                        Z12.11, Encounter for screening for malignant neoplasm                         of colon CPT copyright 2019 American Medical Association. All rights reserved. The codes documented in this report are preliminary and upon coder review may  be revised to meet current compliance requirements. Jonathon Bellows, MD Jonathon Bellows MD, MD 04/17/2019 8:35:49 AM This report has been signed electronically. Number of Addenda: 0 Note Initiated On: 04/17/2019 8:14 AM Scope Withdrawal Time: 0 hours 12 minutes 24 seconds  Total Procedure Duration: 0 hours 15 minutes 21 seconds  Estimated Blood Loss:  Estimated blood loss: none.      [facility]
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
## 2019-05-01 ENCOUNTER — Telehealth: Payer: Self-pay

## 2019-05-01 NOTE — Telephone Encounter (Signed)
Patient paid out of pocket for C-PREP. Is now looking for a reimbursement. [facility] in [place] says our office can call  [phone] & let the insurance company know the prescription was medically needed.

## 2019-08-07 ENCOUNTER — Other Ambulatory Visit: Payer: Self-pay

## 2019-08-07 ENCOUNTER — Ambulatory Visit: Payer: BC Managed Care – PPO | Admitting: Primary Care

## 2019-08-07 ENCOUNTER — Encounter: Payer: Self-pay | Admitting: Primary Care

## 2019-08-07 VITALS — BP 120/82 | HR 80 | Temp 95.8°F | Ht 67.0 in | Wt 207.2 lb

## 2019-08-07 DIAGNOSIS — R7303 Prediabetes: Secondary | ICD-10-CM | POA: Diagnosis not present

## 2019-08-07 DIAGNOSIS — R21 Rash and other nonspecific skin eruption: Secondary | ICD-10-CM | POA: Insufficient documentation

## 2019-08-07 DIAGNOSIS — R519 Headache, unspecified: Secondary | ICD-10-CM

## 2019-08-07 DIAGNOSIS — B351 Tinea unguium: Secondary | ICD-10-CM

## 2019-08-07 DIAGNOSIS — R002 Palpitations: Secondary | ICD-10-CM

## 2019-08-07 HISTORY — DX: Palpitations: R00.2

## 2019-08-07 LAB — COMPREHENSIVE METABOLIC PANEL
ALT: 16 U/L (ref 0–35)
AST: 16 U/L (ref 0–37)
Albumin: 4.2 g/dL (ref 3.5–5.2)
Alkaline Phosphatase: 53 U/L (ref 39–117)
BUN: 9 mg/dL (ref 6–23)
CO2: 29 mEq/L (ref 19–32)
Calcium: 9.1 mg/dL (ref 8.4–10.5)
Chloride: 104 mEq/L (ref 96–112)
Creatinine, Ser: 0.74 mg/dL (ref 0.40–1.20)
GFR: 101.8 mL/min (ref >60.00)
Glucose, Bld: 93 mg/dL (ref 70–99)
Potassium: 4.7 mEq/L (ref 3.5–5.1)
Sodium: 136 mEq/L (ref 135–145)
Total Bilirubin: 0.5 mg/dL (ref 0.2–1.2)
Total Protein: 6.9 g/dL (ref 6.0–8.3)

## 2019-08-07 LAB — LIPID PANEL
Cholesterol: 173 mg/dL (ref 0–200)
HDL: 46.5 mg/dL (ref >39.00)
LDL Cholesterol: 112 mg/dL — ABNORMAL HIGH (ref 0–99)
NonHDL: 126.83
Total CHOL/HDL Ratio: 4
Triglycerides: 75 mg/dL (ref 0.0–149.0)
VLDL: 15 mg/dL (ref 0.0–40.0)

## 2019-08-07 LAB — HEMOGLOBIN A1C: Hgb A1c MFr Bld: 5.8 % (ref 4.6–6.5)

## 2019-08-07 LAB — CBC
HCT: 37.1 % (ref 36.0–46.0)
Hemoglobin: 12.3 g/dL (ref 12.0–15.0)
MCHC: 33.2 g/dL (ref 30.0–36.0)
MCV: 90.9 fl (ref 78.0–100.0)
Platelets: 376 10*3/uL (ref 150.0–400.0)
RBC: 4.09 Mil/uL (ref 3.87–5.11)
RDW: 13.2 % (ref 11.5–15.5)
WBC: 3.7 10*3/uL — ABNORMAL LOW (ref 4.0–10.5)

## 2019-08-07 LAB — TSH: TSH: 1.07 u[IU]/mL (ref 0.35–4.50)

## 2019-08-07 MED ORDER — TRIAMCINOLONE ACETONIDE 0.1 % EX CREA: 1.0000 "application " | TOPICAL_CREAM | Freq: Two times a day (BID) | CUTANEOUS | 0 refills | Status: DC

## 2019-08-07 MED ORDER — TERBINAFINE HCL 250 MG PO TABS: 250.0000 mg | ORAL_TABLET | Freq: Every day | ORAL | 0 refills | Status: DC

## 2019-08-07 NOTE — Patient Instructions (Signed)
Stop by the lab prior to leaving today. I will notify you of your results once received.   You can apply the triamcinolone cream twice daily for about one week, then as needed. This is for the itchy skin.  Start terbinafine (Lamisil) 250 mg once daily for toenail fungus. Please update me in 3 weeks.  It was a pleasure to see you today!

## 2019-08-07 NOTE — Assessment & Plan Note (Signed)
Evident to right toenails.  No improvement with OTC treatment. CMP from early 2021 reviewed and LFT's are normal.  Rx for Lamisil 250 mg sent to pharmacy. She will update in 3-4 weeks.

## 2019-08-07 NOTE — Assessment & Plan Note (Signed)
Unclear if this is actually fungal or eczema.  Will treat with topical steroid as she's tried topical antifungal treatment OTC without improvement.  She will update.

## 2019-08-07 NOTE — Assessment & Plan Note (Signed)
Two day history that occurred two weeks ago, no symptoms since. Also under a lot of stress.  Checking labs today. ECG today with NSR with rate of 64, no PAC/PVC, acute ST changes. Appears similar to ECG from 2017.  She will update if symptoms return. Consider cardiology evaluation at that point.
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
## 2019-08-07 NOTE — Assessment & Plan Note (Signed)
Chronic, daily headaches for years, no change to intensity or frequency. I offered preventative treatment for headaches, she kindly declines as headaches are manageable.  MRI from 2020 reviewed.

## 2019-08-23 LAB — HM MAMMOGRAPHY

## 2019-09-12 ENCOUNTER — Encounter: Payer: Self-pay | Admitting: Primary Care

## 2020-01-19 ENCOUNTER — Telehealth (INDEPENDENT_AMBULATORY_CARE_PROVIDER_SITE_OTHER): Payer: 59 | Admitting: Internal Medicine

## 2020-01-19 ENCOUNTER — Encounter: Payer: Self-pay | Admitting: Internal Medicine

## 2020-01-19 DIAGNOSIS — U071 COVID-19: Secondary | ICD-10-CM | POA: Diagnosis not present

## 2020-01-19 DIAGNOSIS — R059 Cough, unspecified: Secondary | ICD-10-CM

## 2020-01-19 DIAGNOSIS — R0982 Postnasal drip: Secondary | ICD-10-CM | POA: Diagnosis not present

## 2020-01-19 MED ORDER — BENZONATATE 200 MG PO CAPS: 200.0000 mg | ORAL_CAPSULE | Freq: Three times a day (TID) | ORAL | 0 refills | Status: DC | PRN
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
## 2020-01-19 NOTE — Patient Instructions (Signed)
COVID-19 COVID-19 is a respiratory infection that is caused by a virus called severe acute respiratory syndrome coronavirus 2 (SARS-CoV-2). The disease is also known as coronavirus disease or novel coronavirus. In some people, the virus may not cause any symptoms. In others, it may cause a serious infection. The infection can get worse quickly and can lead to complications, such as:  Pneumonia, or infection of the lungs.  Acute respiratory distress syndrome or ARDS. This is a condition in which fluid build-up in the lungs prevents the lungs from filling with air and passing oxygen into the blood.  Acute respiratory failure. This is a condition in which there is not enough oxygen passing from the lungs to the body or when carbon dioxide is not passing from the lungs out of the body.  Sepsis or septic shock. This is a serious bodily reaction to an infection.  Blood clotting problems.  Secondary infections due to bacteria or fungus.  Organ failure. This is when your body's organs stop working. The virus that causes COVID-19 is contagious. This means that it can spread from person to person through droplets from coughs and sneezes (respiratory secretions). What are the causes? This illness is caused by a virus. You may catch the virus by:  Breathing in droplets from an infected person. Droplets can be spread by a person breathing, speaking, singing, coughing, or sneezing.  Touching something, like a table or a doorknob, that was exposed to the virus (contaminated) and then touching your mouth, nose, or eyes. What increases the risk? Risk for infection You are more likely to be infected with this virus if you:  Are within 6 feet (2 meters) of a person with COVID-19.  Provide care for or live with a person who is infected with COVID-19.  Spend time in crowded indoor spaces or live in shared housing. Risk for serious illness You are more likely to become seriously ill from the virus if you:   Are 50 years of age or older. The higher your age, the more you are at risk for serious illness.  Live in a nursing home or long-term care facility.  Have cancer.  Have a long-term (chronic) disease such as: ? Chronic lung disease, including chronic obstructive pulmonary disease or asthma. ? A long-term disease that lowers your body's ability to fight infection (immunocompromised). ? Heart disease, including heart failure, a condition in which the arteries that lead to the heart become narrow or blocked (coronary artery disease), a disease which makes the heart muscle thick, weak, or stiff (cardiomyopathy). ? Diabetes. ? Chronic kidney disease. ? Sickle cell disease, a condition in which red blood cells have an abnormal "sickle" shape. ? Liver disease.  Are obese. What are the signs or symptoms? Symptoms of this condition can range from mild to severe. Symptoms may appear any time from 2 to 14 days after being exposed to the virus. They include:  A fever or chills.  A cough.  Difficulty breathing.  Headaches, body aches, or muscle aches.  Runny or stuffy (congested) nose.  A sore throat.  New loss of taste or smell. Some people may also have stomach problems, such as nausea, vomiting, or diarrhea. Other people may not have any symptoms of COVID-19. How is this diagnosed? This condition may be diagnosed based on:  Your signs and symptoms, especially if: ? You live in an area with a COVID-19 outbreak. ? You recently traveled to or from an area where the virus is common. ? You   provide care for or live with a person who was diagnosed with COVID-19. ? You were exposed to a person who was diagnosed with COVID-19.  A physical exam.  Lab tests, which may include: ? Taking a sample of fluid from the back of your nose and throat (nasopharyngeal fluid), your nose, or your throat using a swab. ? A sample of mucus from your lungs (sputum). ? Blood tests.  Imaging tests, which  may include, X-rays, CT scan, or ultrasound. How is this treated? At present, there is no medicine to treat COVID-19. Medicines that treat other diseases are being used on a trial basis to see if they are effective against COVID-19. Your health care provider will talk with you about ways to treat your symptoms. For most people, the infection is mild and can be managed at home with rest, fluids, and over-the-counter medicines. Treatment for a serious infection usually takes places in a hospital intensive care unit (ICU). It may include one or more of the following treatments. These treatments are given until your symptoms improve.  Receiving fluids and medicines through an IV.  Supplemental oxygen. Extra oxygen is given through a tube in the nose, a face mask, or a hood.  Positioning you to lie on your stomach (prone position). This makes it easier for oxygen to get into the lungs.  Continuous positive airway pressure (CPAP) or bi-level positive airway pressure (BPAP) machine. This treatment uses mild air pressure to keep the airways open. A tube that is connected to a motor delivers oxygen to the body.  Ventilator. This treatment moves air into and out of the lungs by using a tube that is placed in your windpipe.  Tracheostomy. This is a procedure to create a hole in the neck so that a breathing tube can be inserted.  Extracorporeal membrane oxygenation (ECMO). This procedure gives the lungs a chance to recover by taking over the functions of the heart and lungs. It supplies oxygen to the body and removes carbon dioxide. Follow these instructions at home: Lifestyle  If you are sick, stay home except to get medical care. Your health care provider will tell you how long to stay home. Call your health care provider before you go for medical care.  Rest at home as told by your health care provider.  Do not use any products that contain nicotine or tobacco, such as cigarettes, e-cigarettes, and  chewing tobacco. If you need help quitting, ask your health care provider.  Return to your normal activities as told by your health care provider. Ask your health care provider what activities are safe for you. General instructions  Take over-the-counter and prescription medicines only as told by your health care provider.  Drink enough fluid to keep your urine pale yellow.  Keep all follow-up visits as told by your health care provider. This is important. How is this prevented?  There is no vaccine to help prevent COVID-19 infection. However, there are steps you can take to protect yourself and others from this virus. To protect yourself:   Do not travel to areas where COVID-19 is a risk. The areas where COVID-19 is reported change often. To identify high-risk areas and travel restrictions, check the CDC travel website: wwwnc.cdc.gov/travel/notices  If you live in, or must travel to, an area where COVID-19 is a risk, take precautions to avoid infection. ? Stay away from people who are sick. ? Wash your hands often with soap and water for 20 seconds. If soap and water   are not available, use an alcohol-based hand sanitizer. ? Avoid touching your mouth, face, eyes, or nose. ? Avoid going out in public, follow guidance from your state and local health authorities. ? If you must go out in public, wear a cloth face covering or face mask. Make sure your mask covers your nose and mouth. ? Avoid crowded indoor spaces. Stay at least 6 feet (2 meters) away from others. ? Disinfect objects and surfaces that are frequently touched every day. This may include:  Counters and tables.  Doorknobs and light switches.  Sinks and faucets.  Electronics, such as phones, remote controls, keyboards, computers, and tablets. To protect others: If you have symptoms of COVID-19, take steps to prevent the virus from spreading to others.  If you think you have a COVID-19 infection, contact your health care  provider right away. Tell your health care team that you think you may have a COVID-19 infection.  Stay home. Leave your house only to seek medical care. Do not use public transport.  Do not travel while you are sick.  Wash your hands often with soap and water for 20 seconds. If soap and water are not available, use alcohol-based hand sanitizer.  Stay away from other members of your household. Let healthy household members care for children and pets, if possible. If you have to care for children or pets, wash your hands often and wear a mask. If possible, stay in your own room, separate from others. Use a different bathroom.  Make sure that all people in your household wash their hands well and often.  Cough or sneeze into a tissue or your sleeve or elbow. Do not cough or sneeze into your hand or into the air.  Wear a cloth face covering or face mask. Make sure your mask covers your nose and mouth. Where to find more information  Centers for Disease Control and Prevention: www.cdc.gov/coronavirus/2019-ncov/index.html  World Health Organization: www.who.int/health-topics/coronavirus Contact a health care provider if:  You live in or have traveled to an area where COVID-19 is a risk and you have symptoms of the infection.  You have had contact with someone who has COVID-19 and you have symptoms of the infection. Get help right away if:  You have trouble breathing.  You have pain or pressure in your chest.  You have confusion.  You have bluish lips and fingernails.  You have difficulty waking from sleep.  You have symptoms that get worse. These symptoms may represent a serious problem that is an emergency. Do not wait to see if the symptoms will go away. Get medical help right away. Call your local emergency services (911 in the [place] Do not drive yourself to the hospital. Let the emergency medical personnel know if you think you have COVID-19. Summary  COVID-19 is a  respiratory infection that is caused by a virus. It is also known as coronavirus disease or novel coronavirus. It can cause serious infections, such as pneumonia, acute respiratory distress syndrome, acute respiratory failure, or sepsis.  The virus that causes COVID-19 is contagious. This means that it can spread from person to person through droplets from breathing, speaking, singing, coughing, or sneezing.  You are more likely to develop a serious illness if you are 50 years of age or older, have a weak immune system, live in a nursing home, or have chronic disease.  There is no medicine to treat COVID-19. Your health care provider will talk with you about ways to treat your symptoms.    Take steps to protect yourself and others from infection. Wash your hands often and disinfect objects and surfaces that are frequently touched every day. Stay away from people who are sick and wear a mask if you are sick. This information is not intended to replace advice given to you by your health care provider. Make sure you discuss any questions you have with your health care provider. Document Revised: 11/01/2018 Document Reviewed: 02/07/2018 Elsevier Patient Education  2020 Elsevier Inc.  

## 2020-01-21 ENCOUNTER — Telehealth: Payer: Self-pay | Admitting: *Deleted

## 2020-01-21 NOTE — Telephone Encounter (Signed)
Agree she should not go to work tomorrow. Can we get more information on her symptoms?

## 2020-01-21 NOTE — Telephone Encounter (Signed)
I spoke to pt and she is just concerned with the increase fatigue and a headache started... no other concerns... explained to pt Sx can come and go, that the fatigue can last for weeks, to months, to year, no way of knowing... pt is not having fevers, difficulty breathing or SOB... pt is taking allergy pill at bedtime, vit c, zinc and vitamin d QD.... f/u, UC/ER precautions given, pt is aware... nothing further needed at this time

## 2020-01-21 NOTE — Telephone Encounter (Signed)
Patient called stating that she did a virtual visit with [name] NP this week. Patient stated that she tested positive for covid 01/13/20. Patient stated that she planned on going back to work tomorrow but has concerns about that. Patient stated that she has talked with her boss and can work from home. Patient stated that she took the cough medication yesterday and she feels worse today. Patient stated that she has a dry cough, scratchy throat and is real tired. Patient stated that she has a lot of head congestion. Patient wanted to know what [name] NP recommends that she do since she is feeling worse today. Patient denies SOB or difficulty breathing. Patient was advised with her ongoing symptoms she should not go back into the office to work tomorrow.

## 2020-01-28 ENCOUNTER — Encounter (INDEPENDENT_AMBULATORY_CARE_PROVIDER_SITE_OTHER): Payer: Self-pay

## 2020-02-03 ENCOUNTER — Encounter (INDEPENDENT_AMBULATORY_CARE_PROVIDER_SITE_OTHER): Payer: Self-pay

## 2020-02-09 ENCOUNTER — Telehealth: Payer: Self-pay

## 2020-02-09 NOTE — Telephone Encounter (Signed)
[facility] Night - Client TELEPHONE ADVICE RECORD [facility] Patient Name: Raven Bauer Gender: Female DOB: October 30, 1970 Age: 50 Y 5 M 26 D Return Phone Number: [phone] (Primary) Address[place] [address] Client [facility] Night - Client Client Site  [facility] - Night Physician Vernona Rieger - NP Contact Type Call Who Is Calling Patient / Member / Family / Caregiver Call Type Triage / Clinical Relationship To Patient Self Return Phone Number [phone] (Primary) Chief Complaint Immunization Reaction Reason for Call Symptomatic / Request for Health Information Initial Comment Caller states she recently had a COVID booster. States she is having arm swelling. Wants to know what they should be doing. Translation No Nurse Assessment Nurse: Josephine Cables, RN, Caitlin Date/Time (Eastern Time): 02/08/2020 4:10:23 PM Confirm and document reason for call. If symptomatic, describe symptoms. ---Caller states she recently had a COVID booster Thursday. States she is having arm swelling under arm, did have tenderness. Denies fever. Does the patient have any new or worsening symptoms? ---Yes Will a triage be completed? ---Yes Related visit to physician within the last 2 weeks? ---N/A Does the PT have any chronic conditions? (i.e. diabetes, asthma, this includes High risk factors for pregnancy, etc.) ---No Is the patient pregnant or possibly pregnant? (Ask all females between the ages of 12-55) ---No Is this a behavioral health or substance abuse call? ---No Guidelines Guideline Title Affirmed Question Affirmed Notes Nurse Date/Time (Eastern Time) COVID-19 - Vaccine Questions and Reactions COVID-19 vaccine, injection site reaction (e.g., pain, redness, swelling), question about Josephine Cables, RN, [facility] 02/08/2020 4:14:42 PM Disp. Time Lamount Cohen Time) Disposition Final User 02/08/2020 4:19:21 PM Home  Care Yes Josephine Cables, RN, Glori Luis Disagree/Comply Comply PLEASE NOTE: All timestamps contained within this report are represented as [place] Standard Time. CONFIDENTIALTY NOTICE: This fax transmission is intended only for the addressee. It contains information that is legally privileged, confidential or otherwise protected from use or disclosure. If you are not the intended recipient, you are strictly prohibited from reviewing, disclosing, copying using or disseminating any of this information or taking any action in reliance on or regarding this information. If you have received this fax in error, please notify us immediately by telephone so that we can arrange for its return to [place]. Phone: [phone], Toll-Free: [phone], Fax: [phone] Page: 2 of 2 Call Id: [id number] Caller Understands Yes PreDisposition InappropriateToAsk Care Advice Given Per Guideline HOME CARE: * You should be able to treat this at home. REASSURANCE AND EDUCATION - ENLARGED LYMPH NODES AFTER VACCINATION: * The vaccine can cause swelling of lymph nodes in the armpit or neck on the side you got the shot. * A LOCAL REACTION can occur at the injection site after a COVID-19 vaccination. These symptoms usually last 1 to 3 days. Side effects are more frequent after the second vaccine shot. These are signs that your vaccine is working and triggering your immune system. REASSURANCE AND EDUCATION - LOCAL VACCINE REACTION: * This is one sign your vaccine is working and triggering your immune system. * It will usually go away on its own within a couple weeks. * If the swollen lymph nodes (lump) does not go away after 3 weeks, or is worsening, you should have it checked by your doctor. * This usually happens 2 to 4 days after getting the vaccine. CALL BACK IF: * Fever lasts over 3 days * Pain at injection site not improving after 3 days * Swollen lymph node lasts over 3  weeks * You become worse

## 2020-02-09 NOTE — Telephone Encounter (Signed)
Pt denies any pain or discomfort in the area. Swelling is the same size and about the size of her index finger. Denies any redness or warmth or any other symptoms. Pt reports her sister is a Engineer, civil (consulting) and she recommended to contact office just to let us know. Advised pt if swelling got any larger or she developed any new symptoms to contact the office. Pt verbalized understanding.    Documented all covid vaccines because they were not in pt chart.

## 2020-02-09 NOTE — Telephone Encounter (Signed)
Noted and agree. 

## 2020-02-10 ENCOUNTER — Encounter (INDEPENDENT_AMBULATORY_CARE_PROVIDER_SITE_OTHER): Payer: Self-pay | Admitting: Family Medicine

## 2020-02-10 ENCOUNTER — Ambulatory Visit (INDEPENDENT_AMBULATORY_CARE_PROVIDER_SITE_OTHER): Payer: 59 | Admitting: Family Medicine

## 2020-02-10 ENCOUNTER — Other Ambulatory Visit: Payer: Self-pay

## 2020-02-10 VITALS — BP 131/82 | HR 88 | Temp 98.2°F | Ht 68.0 in | Wt 204.0 lb

## 2020-02-10 DIAGNOSIS — R0602 Shortness of breath: Secondary | ICD-10-CM

## 2020-02-10 DIAGNOSIS — R5383 Other fatigue: Secondary | ICD-10-CM

## 2020-02-10 DIAGNOSIS — Z0289 Encounter for other administrative examinations: Secondary | ICD-10-CM

## 2020-02-10 DIAGNOSIS — D649 Anemia, unspecified: Secondary | ICD-10-CM | POA: Insufficient documentation

## 2020-02-10 DIAGNOSIS — O1490 Unspecified pre-eclampsia, unspecified trimester: Secondary | ICD-10-CM

## 2020-02-10 DIAGNOSIS — E66811 Obesity, class 1: Secondary | ICD-10-CM

## 2020-02-10 DIAGNOSIS — D508 Other iron deficiency anemias: Secondary | ICD-10-CM

## 2020-02-10 DIAGNOSIS — F3289 Other specified depressive episodes: Secondary | ICD-10-CM | POA: Diagnosis not present

## 2020-02-10 DIAGNOSIS — R7303 Prediabetes: Secondary | ICD-10-CM

## 2020-02-10 DIAGNOSIS — F32A Depression, unspecified: Secondary | ICD-10-CM | POA: Insufficient documentation

## 2020-02-10 DIAGNOSIS — E669 Obesity, unspecified: Secondary | ICD-10-CM

## 2020-02-10 DIAGNOSIS — Z6831 Body mass index (BMI) 31.0-31.9, adult: Secondary | ICD-10-CM

## 2020-02-10 DIAGNOSIS — Z9189 Other specified personal risk factors, not elsewhere classified: Secondary | ICD-10-CM | POA: Diagnosis not present

## 2020-02-10 HISTORY — DX: Unspecified pre-eclampsia, unspecified trimester: O14.90

## 2020-02-11 LAB — CBC WITH DIFFERENTIAL/PLATELET
Basophils Absolute: 0 10*3/uL (ref 0.0–0.2)
Basos: 1 %
EOS (ABSOLUTE): 0.1 10*3/uL (ref 0.0–0.4)
Eos: 2 %
Hematocrit: 36.2 % (ref 34.0–46.6)
Hemoglobin: 12.1 g/dL (ref 11.1–15.9)
Immature Grans (Abs): 0 10*3/uL (ref 0.0–0.1)
Immature Granulocytes: 0 %
Lymphocytes Absolute: 1.6 10*3/uL (ref 0.7–3.1)
Lymphs: 40 %
MCH: 30.1 pg (ref 26.6–33.0)
MCHC: 33.4 g/dL (ref 31.5–35.7)
MCV: 90 fL (ref 79–97)
Monocytes Absolute: 0.4 10*3/uL (ref 0.1–0.9)
Monocytes: 9 %
Neutrophils Absolute: 2 10*3/uL (ref 1.4–7.0)
Neutrophils: 48 %
Platelets: 369 10*3/uL (ref 150–450)
RBC: 4.02 x10E6/uL (ref 3.77–5.28)
RDW: 12.3 % (ref 11.7–15.4)
WBC: 4.1 10*3/uL (ref 3.4–10.8)

## 2020-02-11 LAB — LIPID PANEL
Chol/HDL Ratio: 4.4 ratio (ref 0.0–4.4)
Cholesterol, Total: 197 mg/dL (ref 100–199)
HDL: 45 mg/dL (ref >39)
LDL Chol Calc (NIH): 134 mg/dL — ABNORMAL HIGH (ref 0–99)
Triglycerides: 98 mg/dL (ref 0–149)
VLDL Cholesterol Cal: 18 mg/dL (ref 5–40)

## 2020-02-11 LAB — COMPREHENSIVE METABOLIC PANEL
ALT: 20 IU/L (ref 0–32)
AST: 20 IU/L (ref 0–40)
Albumin/Globulin Ratio: 1.5 (ref 1.2–2.2)
Albumin: 4.4 g/dL (ref 3.8–4.8)
Alkaline Phosphatase: 77 IU/L (ref 44–121)
BUN/Creatinine Ratio: 9 (ref 9–23)
BUN: 7 mg/dL (ref 6–24)
Bilirubin Total: 0.3 mg/dL (ref 0.0–1.2)
CO2: 24 mmol/L (ref 20–29)
Calcium: 9.2 mg/dL (ref 8.7–10.2)
Chloride: 101 mmol/L (ref 96–106)
Creatinine, Ser: 0.76 mg/dL (ref 0.57–1.00)
GFR calc Af Amer: 108 mL/min/{1.73_m2} (ref >59)
GFR calc non Af Amer: 94 mL/min/{1.73_m2} (ref >59)
Globulin, Total: 3 g/dL (ref 1.5–4.5)
Glucose: 90 mg/dL (ref 65–99)
Potassium: 4.5 mmol/L (ref 3.5–5.2)
Sodium: 140 mmol/L (ref 134–144)
Total Protein: 7.4 g/dL (ref 6.0–8.5)

## 2020-02-11 LAB — HEMOGLOBIN A1C
Est. average glucose Bld gHb Est-mCnc: 123 mg/dL
Hgb A1c MFr Bld: 5.9 % — ABNORMAL HIGH (ref 4.8–5.6)

## 2020-02-11 LAB — INSULIN, RANDOM: INSULIN: 9 u[IU]/mL (ref 2.6–24.9)

## 2020-02-11 LAB — TSH: TSH: 1.68 u[IU]/mL (ref 0.450–4.500)

## 2020-02-11 LAB — T4, FREE: Free T4: 1.38 ng/dL (ref 0.82–1.77)

## 2020-02-11 LAB — T3: T3, Total: 129 ng/dL (ref 71–180)

## 2020-02-11 LAB — VITAMIN D 25 HYDROXY (VIT D DEFICIENCY, FRACTURES): Vit D, 25-Hydroxy: 24.3 ng/mL — ABNORMAL LOW (ref 30.0–100.0)

## 2020-02-11 LAB — VITAMIN B12: Vitamin B-12: 1461 pg/mL — ABNORMAL HIGH (ref 232–1245)

## 2020-02-11 LAB — FOLATE: Folate: 10.5 ng/mL (ref >3.0)

## 2020-02-11 NOTE — Progress Notes (Signed)
Chief Complaint:   OBESITY  Raven Bauer (MR# [id number]) is a 50 y.o. female who presents for evaluation and treatment of obesity and related comorbidities. Current BMI is Body mass index is 31.02 kg/m. Raven Bauer has been struggling with her weight for many years and has been unsuccessful in either losing weight, maintaining weight loss, or reaching her healthy weight goal.  Raven Bauer is currently in Raven action stage of change and ready to dedicate time achieving and maintaining a healthier weight. Raven Bauer is interested in becoming our patient and working on intensive lifestyle modifications including (but not limited to) diet and exercise for weight loss.  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 Raven Bauer lives with her husband, Raven Bauer, 74 y.o. daughter, and 62 y.o. son. Raven Bauer snacks on and craves pizza, cookies, pasta, muffins, and peanut butter crackers. Raven Bauer drinks juice, sweet tea, and smoothies. Raven Bauer did [employer] in Raven past.  Raven Bauer's habits were reviewed today and are as follows: Raven Bauer thinks her family will eat healthier with her, her desired weight loss is 34 lbs, Raven Bauer started gaining weight after pregnancy in 1999, her heaviest weight ever was 206 pounds, Raven Bauer has significant food cravings issues, Raven Bauer skips meals frequently, Raven Bauer is frequently drinking liquids with calories, Raven Bauer frequently makes poor food choices and Raven Bauer struggles with emotional eating.  This is Raven patient's first visit at [facility].  Raven patient's NEW PATIENT PACKET that they filled out prior to today's office visit was reviewed at length and information from that paperwork was included within Raven following office visit note.    Included in Raven packet: current and past health history, medications, allergies, ROS, gynecologic history (women only), surgical history, family history, social history, weight history, weight loss surgery history (for those that have had weight loss surgery), nutritional  evaluation, mood and food questionnaire along with a depression screening (PHQ9) on all patients, an Epworth questionnaire, sleep habits questionnaire, patient life and health improvement goals questionnaire. These will all be scanned into Raven patient's chart under media.   During Raven visit, I independently reviewed Raven patient's EKG, bioimpedance scale results, and indirect calorimeter results. I used this information to tailor a meal plan for Raven patient that will help Raven Bauer to lose weight and will improve her obesity-related conditions going forward.  I performed a medically necessary appropriate examination and/or evaluation. I discussed Raven assessment and treatment plan with Raven patient. Raven patient was provided an opportunity to ask questions and all were answered. Raven patient agreed with Raven plan and demonstrated an understanding of Raven instructions. Labs were ordered today (unless patient declined them) and will be reviewed with Raven patient at our next visit unless more critical results need to be addressed immediately. Clinical information was updated and documented in Raven EMR.  Time spent on visit including pre-visit chart review and post-visit care was estimated to be 55 minutes.  A separate 15 minutes was spent on risk counseling (see above/below).   Depression Screen Raven Bauer's Food and Mood (modified PHQ-9) score was 11.  Depression screen [facility] [employer] 2/9 02/10/2020  Decreased Interest 2  Down, Depressed, Hopeless 1  PHQ - 2 Score 3  Altered sleeping 1  Tired, decreased energy 3  Change in appetite 2  Feeling bad or failure about yourself  1  Trouble concentrating 1  Moving slowly or fidgety/restless 0  Suicidal thoughts 0  PHQ-9 Score 11  Difficult doing work/chores Somewhat difficult    Assessment/Plan:   1. SOBOE (  shortness of breath on exertion) Raven Bauer notes increasing shortness of breath with exercising and seems to be worsening over time with weight gain. Raven Bauer notes getting out of  breath sooner with activity than Raven Bauer used to. This has gotten worse recently. Raven Bauer denies shortness of breath at rest or orthopnea.  Plan: Raven Bauer does feel that Raven Bauer gets out of breath more easily that Raven Bauer used to when Raven Bauer exercises. Raven Bauer's shortness of breath appears to be obesity related and exercise induced. Raven Bauer has agreed to work on weight loss and gradually increase exercise to treat her exercise induced shortness of breath. Will continue to monitor closely.  Lab/Orders today or future: - Vitamin B12 - CBC with Differential/Platelet - Comprehensive metabolic panel - Folate - Hemoglobin A1c - Insulin, random - Lipid panel - T3 - T4, free - TSH - VITAMIN D 25 Hydroxy (Vit-D Deficiency, Fractures)  2. Other fatigue Raven Bauer admits to daytime somnolence and admits to waking up still tired. Patent has a history of symptoms of morning headache. Raven Bauer generally gets 7 or 8 hours of sleep per night, and states that Raven Bauer has poor sleep quality. Snoring is present. Apneic episodes are not present. Epworth Sleepiness Score is 3.  Plan: Raven Bauer does feel that her weight is causing her energy to be lower than it should be. Fatigue may be related to obesity, depression or many other causes. Labs will be ordered, and in Raven meanwhile, [name] will focus on self care including making healthy food choices, increasing physical activity and focusing on stress reduction.  Lab/Orders today or future: - EKG 12-Lead - Vitamin B12 - CBC with Differential/Platelet - Comprehensive metabolic panel - Folate - Hemoglobin A1c - Insulin, random - Lipid panel - T3 - T4, free - TSH - VITAMIN D 25 Hydroxy (Vit-D Deficiency, Fractures)  3. Pre-diabetes Pt was diagnosed at age 19. Raven Bauer is not on meds.  Raven Bauer has a diagnosis of prediabetes based on her elevated HgA1c and was informed this puts her at greater risk of developing diabetes. Raven Bauer continues to work on diet and exercise to decrease her risk of  diabetes. Raven Bauer denies nausea or hypoglycemia.  Lab Results  Component Value Date   HGBA1C 5.9 (H) 02/10/2020   Lab Results  Component Value Date   INSULIN 9.0 02/10/2020   Plan: Raven Bauer will continue to work on weight loss, exercise, and decreasing simple carbohydrates to help decrease Raven risk of diabetes. Check labs today.  Lab/Orders today or future: - Vitamin B12 - CBC with Differential/Platelet - Comprehensive metabolic panel - Folate - Hemoglobin A1c - Insulin, random - Lipid panel - T3 - T4, free - TSH - VITAMIN D 25 Hydroxy (Vit-D Deficiency, Fractures)  4. Other iron deficiency anemia Pt has been iron deficient since her 1st pregnancy. Raven Bauer takes ferrous sulfate occasionally and OTC multi-vitamin daily. Raven Bauer is not a vegetarian.  Raven Bauer does not have a history of weight loss surgery.   CBC Latest Ref Rng & Units 02/10/2020 08/07/2019 03/12/2019  WBC 3.4 - 10.8 x10E3/uL 4.1 3.7(L) 4.9  Hemoglobin 11.1 - 15.9 g/dL 02.6 37.8 58.8  Hematocrit 34.0 - 46.6 % 36.2 37.1 36.4  Platelets 150 - 450 x10E3/uL 369 376.0 420.0(H)   No results found for: IRON, TIBC, FERRITIN Lab Results  Component Value Date   VITAMINB12 1,461 (H) 02/10/2020    Plan: Raven diagnosis was reviewed with Raven patient. Counseling provided today, see below. We will continue to monitor. Orders and follow up as documented in patient record.  Counseling . Raven body needs vitamin B12: to make red blood cells; to make DNA; and to help Raven nerves work properly so they can carry messages from Raven brain to Raven body.  . Raven main causes of vitamin B12 deficiency include dietary deficiency, digestive diseases, pernicious anemia, and having a surgery in which part of Raven stomach or small intestine is removed.  . Certain medicines can make it harder for Raven body to absorb vitamin B12. These medicines include: heartburn medications; some antibiotics; some medications used to treat diabetes, gout, and high cholesterol.  . In  some cases, there are no symptoms of this condition. If Raven condition leads to anemia or nerve damage, various symptoms can occur, such as weakness or fatigue, shortness of breath, and numbness or tingling in your hands and feet.   . Treatment:  o May include taking vitamin B12 supplements.  o Avoid alcohol.  o Eat lots of healthy foods that contain vitamin B12: - Beef, pork, chicken, [place], and organ meats, such as liver.  - Seafood: This includes clams, rainbow trout, salmon, tuna, and haddock. Eggs.  - Cereal and dairy products that are fortified: This means that vitamin B12 has been added to Raven food.   Lab/Orders today or future: - Vitamin B12 - CBC with Differential/Platelet - Comprehensive metabolic panel - Folate - Hemoglobin A1c - Insulin, random - Lipid panel - T3 - T4, free - TSH - VITAMIN D 25 Hydroxy (Vit-D Deficiency, Fractures)  5. h/o Pre-eclampsia, antepartum Pt diagnosed in 2014. No history of hypertension now or since pregnancy.  Plan: BP is high normal today but is at goal. Will continue to closely monitor.  Lab/Orders today or future: - Vitamin B12 - CBC with Differential/Platelet - Comprehensive metabolic panel - Folate - Hemoglobin A1c - Insulin, random - Lipid panel - T3 - T4, free - TSH - VITAMIN D 25 Hydroxy (Vit-D Deficiency, Fractures)  6. Other depression, with emotional eating PHQ-9 score 11. Pt is not on meds. Raven Bauer is struggling with emotional eating and using food for comfort to Raven extent that it is negatively impacting her health. Raven Bauer has been working on behavior modification techniques to help reduce her emotional eating. Raven Bauer shows no sign of suicidal or homicidal ideations.  Plan: Behavior modification techniques were discussed today to help Kayani deal with her emotional/non-hunger eating behaviors.  Orders and follow up as documented in patient record.   Lab/Orders today or future: - Vitamin B12 - CBC with  Differential/Platelet - Comprehensive metabolic panel - Folate - Hemoglobin A1c - Insulin, random - Lipid panel - T3 - T4, free - TSH - VITAMIN D 25 Hydroxy (Vit-D Deficiency, Fractures)  7. At risk for malnutrition Raven Bauer was given approximately 24 minutes of counseling today regarding prevention of malnutrition and ways to meet macronutrient goals..   8. Class 1 obesity with serious comorbidity and body mass index (BMI) of 31.0 to 31.9 in adult, unspecified obesity type Raven Bauer is currently in Raven action stage of change and her goal is to continue with weight loss efforts. I recommend Reata begin Raven structured treatment plan as follows:  Raven Bauer has agreed to Raven Category 2 Plan.  Exercise goals: As is   Behavioral modification strategies: increasing lean protein intake, meal planning and cooking strategies and planning for success.  Raven Bauer was informed of Raven importance of frequent follow-up visits to maximize her success with intensive lifestyle modifications for her multiple health conditions. Raven Bauer was informed we would discuss her lab results  at her next visit unless there is a critical issue that needs to be addressed sooner. Anaika agreed to keep her next visit at Raven agreed upon time to discuss these results.  Objective:   Blood pressure 131/82, pulse 88, temperature 98.2 F (36.8 C), height 5\' 8"  (1.727 m), weight 204 lb (92.5 kg), SpO2 97 %. Body mass index is 31.02 kg/m.  EKG: Normal sinus rhythm, rate 81.  Indirect Calorimeter completed today shows a VO2 of 266 and a REE of 1853.  Her calculated basal metabolic rate is thus her basal metabolic rate is better than expected.  General: Cooperative, alert, well developed, in no acute distress. HEENT: Conjunctivae and lids unremarkable. Cardiovascular: Regular rhythm.  Lungs: Normal work of breathing. Neurologic: No focal deficits.   Lab Results  Component Value Date   CREATININE 0.76 02/10/2020   BUN 7 02/10/2020    NA 140 02/10/2020   K 4.5 02/10/2020   CL 101 02/10/2020   CO2 24 02/10/2020   Lab Results  Component Value Date   ALT 20 02/10/2020   AST 20 02/10/2020   ALKPHOS 77 02/10/2020   BILITOT 0.3 02/10/2020   Lab Results  Component Value Date   HGBA1C 5.9 (H) 02/10/2020   HGBA1C 5.8 08/07/2019   HGBA1C 5.6 03/12/2019   HGBA1C 5.8 06/09/2016   HGBA1C 5.7 01/21/2016   Lab Results  Component Value Date   INSULIN 9.0 02/10/2020   Lab Results  Component Value Date   TSH 1.680 02/10/2020   Lab Results  Component Value Date   CHOL 197 02/10/2020   HDL 45 02/10/2020   LDLCALC 134 (H) 02/10/2020   TRIG 98 02/10/2020   CHOLHDL 4.4 02/10/2020   Lab Results  Component Value Date   WBC 4.1 02/10/2020   HGB 12.1 02/10/2020   HCT 36.2 02/10/2020   MCV 90 02/10/2020   PLT 369 02/10/2020   No results found for: IRON, TIBC, FERRITIN  Attestation Statements:   Reviewed by clinician on day of visit: allergies, medications, problem list, medical history, surgical history, family history, social history, and previous encounter notes.  02/12/2020, am acting as Edmund Hilda for Energy manager, DO.  I have reviewed Raven above documentation for accuracy and completeness, and I agree with Raven above. [employer], D.O.  Raven 21st Century Cures Act was signed into law in 2016 which includes Raven topic of electronic health records.  This provides immediate access to information in MyChart.  This includes consultation notes, operative notes, office notes, lab results and pathology reports.  If you have any questions about what you read please let 2017 know at your next visit so we can discuss your concerns and take corrective action if need be.  We are right here with you.

## 2020-02-24 ENCOUNTER — Ambulatory Visit (INDEPENDENT_AMBULATORY_CARE_PROVIDER_SITE_OTHER): Payer: 59 | Admitting: Family Medicine

## 2020-02-24 ENCOUNTER — Encounter (INDEPENDENT_AMBULATORY_CARE_PROVIDER_SITE_OTHER): Payer: Self-pay | Admitting: Family Medicine

## 2020-02-24 ENCOUNTER — Other Ambulatory Visit: Payer: Self-pay

## 2020-02-24 VITALS — BP 129/79 | HR 75 | Temp 98.3°F | Ht 68.0 in | Wt 200.0 lb

## 2020-02-24 DIAGNOSIS — E538 Deficiency of other specified B group vitamins: Secondary | ICD-10-CM | POA: Diagnosis not present

## 2020-02-24 DIAGNOSIS — R7303 Prediabetes: Secondary | ICD-10-CM | POA: Diagnosis not present

## 2020-02-24 DIAGNOSIS — Z9189 Other specified personal risk factors, not elsewhere classified: Secondary | ICD-10-CM | POA: Insufficient documentation

## 2020-02-24 DIAGNOSIS — E7849 Other hyperlipidemia: Secondary | ICD-10-CM | POA: Insufficient documentation

## 2020-02-24 DIAGNOSIS — E559 Vitamin D deficiency, unspecified: Secondary | ICD-10-CM | POA: Diagnosis not present

## 2020-02-24 DIAGNOSIS — E669 Obesity, unspecified: Secondary | ICD-10-CM

## 2020-02-24 DIAGNOSIS — Z6830 Body mass index (BMI) 30.0-30.9, adult: Secondary | ICD-10-CM

## 2020-02-24 MED ORDER — VITAMIN D (ERGOCALCIFEROL) 1.25 MG (50000 UNIT) PO CAPS: 50000.0000 [IU] | ORAL_CAPSULE | ORAL | 0 refills | Status: DC

## 2020-02-24 NOTE — Patient Instructions (Signed)
The 10-year ASCVD risk score Denman George DC Montez Hageman., et al., 2013) is: 2%   Values used to calculate the score:     Age: 50 years     Sex: Female     Is Non-Hispanic African American: Yes     Diabetic: No     Tobacco smoker: No     Systolic Blood Pressure: 129 mmHg     Is BP treated: No     HDL Cholesterol: 45 mg/dL     Total Cholesterol: 197 mg/dL

## 2020-02-26 NOTE — Progress Notes (Signed)
Chief Complaint:   OBESITY Raven Bauer is here to discuss her progress with her obesity treatment plan along with follow-up of her obesity related diagnoses.   Today's visit was #: 2 Starting weight: 204 lbs Starting date: 02/10/2020 Today's weight: 200 lbs Today's date: 02/24/2020 Total lbs lost to date: 4 lbs Body mass index is 30.41 kg/m.  Total weight loss percentage to date: -1.96%  Interim History:   Raven Bauer is here today to review her NEW Meal Plan and to discuss all recent labs done here and/ or done at outside facilities. This is patient's first follow up visit. Extended time was spent counseling Raven Bauer on all new disease processes that were discovered or that are worsening.   Raven Bauer says it is difficult to get all of her protein in, especially in the evenings.  She denies hunger or cravings.  No issues with the plan.  Plan:  She had a lot of general questions regarding vegetables, fruits, meal prep, etc.  All discussed with her today.  Nutrition Plan: Category 2 Plan for 100% of the time. Activity: Stationary bike for 15 minutes 1-2 times per week.  Assessment/Plan:   Meds ordered this encounter  Medications  . Vitamin D, Ergocalciferol, (DRISDOL) 1.25 MG (50000 UNIT) CAPS capsule    Sig: Take 1 capsule (50,000 Units total) by mouth every 7 (seven) days.    Dispense:  4 capsule    Refill:  0     1. Prediabetes Worsening.  Discussed labs with patient today.  Not at goal. Goal is HgbA1c < 5.7.  Medication: None.    Plan:  She will continue to focus on protein-rich, low simple carbohydrate foods. We reviewed the importance of hydration, regular exercise for stress reduction, and restorative sleep.   Lab Results  Component Value Date   HGBA1C 5.9 (H) 02/10/2020   Lab Results  Component Value Date   INSULIN 9.0 02/10/2020   2. Other hyperlipidemia Worsening.  Discussed labs with patient today.  Lipid-lowering medications: None.   Plan: Dietary  changes: Increase soluble fiber, decrease simple carbohydrates, decrease saturated fat. Exercise changes: Moderate to vigorous-intensity aerobic activity 150 minutes per week or as tolerated. We will continue to monitor along with PCP/specialists as it pertains to her weight loss journey.  Lab Results  Component Value Date   CHOL 197 02/10/2020   HDL 45 02/10/2020   LDLCALC 134 (H) 02/10/2020   TRIG 98 02/10/2020   CHOLHDL 4.4 02/10/2020   Lab Results  Component Value Date   ALT 20 02/10/2020   AST 20 02/10/2020   ALKPHOS 77 02/10/2020   BILITOT 0.3 02/10/2020   The 10-year ASCVD risk score Denman George DC Jr., et al., 2013) is: 2%   Values used to calculate the score:     Age: 50 years     Sex: Female     Is Non-Hispanic African American: Yes     Diabetic: No     Tobacco smoker: No     Systolic Blood Pressure: 129 mmHg     Is BP treated: No     HDL Cholesterol: 45 mg/dL     Total Cholesterol: 197 mg/dL  3. Vitamin D deficiency New.  Discussed labs with patient today.  Not at goal. Current vitamin D is 24.3, tested on 02/10/2020. Optimal goal > 50 ng/dL.  No history of deficiency, however, for general health, she was taking OTC 1,000 IU daily.  Plan: - Discussed importance of vitamin D  to their health and well-being.  - possible symptoms of low Vitamin D can be low energy, depressed mood, muscle aches, joint aches, osteoporosis etc. - low Vitamin D levels may be linked to an increased risk of cardiovascular events and even increased risk of cancers- such as colon and breast.  - I recommend pt take a weekly prescription vit D - see script below   - Informed patient this may be a lifelong thing, and she was encouraged to continue to take the medicine until told otherwise.   - we will need to monitor levels regularly (every 3-4 mo on average) to keep levels within normal limits.  - weight loss will likely improve availability of vitamin D, thus encouraged Raven Bauer to continue with meal plan  and their weight loss efforts to further improve this condition - pt's questions and concerns regarding this condition addressed.  - Start Vitamin D, Ergocalciferol, (DRISDOL) 1.25 MG (50000 UNIT) CAPS capsule; Take 1 capsule (50,000 Units total) by mouth every 7 (seven) days.  Dispense: 4 capsule; Refill: 0  4. B12 deficiency Lab Results  Component Value Date   VITAMINB12 1,461 (H) 02/10/2020   Discussed labs with patient today.  Supplementation: Vitamin B12 500 mcg daily.    Plan:  Increased level on labs.  Discontinue OTC B12 supplement.  Follow prudent nutritional plan and recheck in 3 months.  5. At risk for heart disease Due to Raven Bauer's current state of health and medical condition(s), she is at a higher risk for heart disease.  This puts the patient at much greater risk to subsequently develop cardiopulmonary conditions that can significantly affect patient's quality of life in a negative manner.    At least 30 minutes were spent on counseling Raven Bauer about these concerns today, and I stressed the importance of reversing risks factors of obesity, especially truncal and visceral fat, hypertension, hyperlipidemia, and pre-diabetes.  The initial goal is to lose at least 5-10% of starting weight to help reduce these risk factors.  Counseling:  Intensive lifestyle modifications were discussed with Raven Bauer as the most appropriate first line of treatment.  she will continue to work on diet, exercise, and weight loss efforts.  We will continue to reassess these conditions on a fairly regular basis in an attempt to decrease the patient's overall morbidity and mortality.  Evidence-based interventions for health behavior change were utilized today including the discussion of self monitoring techniques, problem-solving barriers, and SMART goal setting techniques.  Specifically, regarding patient's less desirable eating habits and patterns, we employed the technique of small changes when Raven Bauer has not  been able to fully commit to her prudent nutritional plan.  6. Class 1 obesity with serious comorbidity and body mass index (BMI) of 30.0 to 30.9 in adult, unspecified obesity type  Course: Raven Bauer is currently in the action stage of change. As such, her goal is to continue with weight loss efforts.   Nutrition goals: She has agreed to the Category 2 Plan.   Exercise goals: As is.  Behavioral modification strategies: increasing lean protein intake, decreasing simple carbohydrates, meal planning and cooking strategies, keeping healthy foods in the home, better snacking choices and planning for success.  Raven Bauer has agreed to follow-up with our clinic in 2 weeks. She was informed of the importance of frequent follow-up visits to maximize her success with intensive lifestyle modifications for her multiple health conditions.   Objective:   Blood pressure 129/79, pulse 75, temperature 98.3 F (36.8 C), height 5\' 8"  (1.727 m), weight  200 lb (90.7 kg), SpO2 100 %. Body mass index is 30.41 kg/m.  General: Cooperative, alert, well developed, in no acute distress. HEENT: Conjunctivae and lids unremarkable. Cardiovascular: Regular rhythm.  Lungs: Normal work of breathing. Neurologic: No focal deficits.   Lab Results  Component Value Date   CREATININE 0.76 02/10/2020   BUN 7 02/10/2020   NA 140 02/10/2020   K 4.5 02/10/2020   CL 101 02/10/2020   CO2 24 02/10/2020   Lab Results  Component Value Date   ALT 20 02/10/2020   AST 20 02/10/2020   ALKPHOS 77 02/10/2020   BILITOT 0.3 02/10/2020   Lab Results  Component Value Date   HGBA1C 5.9 (H) 02/10/2020   HGBA1C 5.8 08/07/2019   HGBA1C 5.6 03/12/2019   HGBA1C 5.8 06/09/2016   HGBA1C 5.7 01/21/2016   Lab Results  Component Value Date   INSULIN 9.0 02/10/2020   Lab Results  Component Value Date   TSH 1.680 02/10/2020   Lab Results  Component Value Date   CHOL 197 02/10/2020   HDL 45 02/10/2020   LDLCALC 134 (H) 02/10/2020    TRIG 98 02/10/2020   CHOLHDL 4.4 02/10/2020   Lab Results  Component Value Date   WBC 4.1 02/10/2020   HGB 12.1 02/10/2020   HCT 36.2 02/10/2020   MCV 90 02/10/2020   PLT 369 02/10/2020   Attestation Statements:   Reviewed by clinician on day of visit: allergies, medications, problem list, medical history, surgical history, family history, social history, and previous encounter notes.  I, Insurance claims handler, CMA, am acting as Energy manager for [employer], DO.  I have reviewed the above documentation for accuracy and completeness, and I agree with the above. Carlye Grippe, D.O.  The 21st Century Cures Act was signed into law in 2016 which includes the topic of electronic health records.  This provides immediate access to information in MyChart.  This includes consultation notes, operative notes, office notes, lab results and pathology reports.  If you have any questions about what you read please let us know at your next visit so we can discuss your concerns and take corrective action if need be.  We are right here with you.

## 2020-03-15 ENCOUNTER — Ambulatory Visit (INDEPENDENT_AMBULATORY_CARE_PROVIDER_SITE_OTHER): Payer: 59 | Admitting: Family Medicine

## 2020-03-15 ENCOUNTER — Encounter (INDEPENDENT_AMBULATORY_CARE_PROVIDER_SITE_OTHER): Payer: Self-pay | Admitting: Family Medicine

## 2020-03-15 ENCOUNTER — Other Ambulatory Visit: Payer: Self-pay

## 2020-03-15 VITALS — BP 120/86 | HR 74 | Temp 98.0°F | Ht 68.0 in | Wt 199.0 lb

## 2020-03-15 DIAGNOSIS — Z9189 Other specified personal risk factors, not elsewhere classified: Secondary | ICD-10-CM | POA: Diagnosis not present

## 2020-03-15 DIAGNOSIS — E559 Vitamin D deficiency, unspecified: Secondary | ICD-10-CM

## 2020-03-15 DIAGNOSIS — E669 Obesity, unspecified: Secondary | ICD-10-CM

## 2020-03-15 DIAGNOSIS — Z6830 Body mass index (BMI) 30.0-30.9, adult: Secondary | ICD-10-CM

## 2020-03-15 MED ORDER — VITAMIN D (ERGOCALCIFEROL) 1.25 MG (50000 UNIT) PO CAPS: 50000.0000 [IU] | ORAL_CAPSULE | ORAL | 0 refills | Status: DC

## 2020-03-17 NOTE — Progress Notes (Signed)
Chief Complaint:   OBESITY Raven Bauer is here to discuss her progress with her obesity treatment plan along with follow-up of her obesity related diagnoses.   Today's visit was #: 3 Starting weight: 204 lbs Starting date: 02/10/2020 Today's weight: 199 lbs Today's date: 03/15/2020 Total lbs lost to date: 5 lbs Body mass index is 30.26 kg/m.  Total weight loss percentage to date: -2.45%  Interim History:  Raven Bauer says she has been so busy at work that she misses lunch at times but eats it around 2 pm and dinner around 6 pm.  She snacks on yogurt and apples.  She is drinking 80 ounces of water per day.  Plan:  Recipes discussed today and handouts given.  Current Meal Plan: the Category 2 Plan for 100% of the time.  Current Exercise Plan: Stationary bike for 20 minutes 2-3 times per week.  Assessment/Plan:   No orders of the defined types were placed in this encounter.   Medications Discontinued During This Encounter  Medication Reason  . Vitamin D, Ergocalciferol, (DRISDOL) 1.25 MG (50000 UNIT) CAPS capsule Reorder     Meds ordered this encounter  Medications  . Vitamin D, Ergocalciferol, (DRISDOL) 1.25 MG (50000 UNIT) CAPS capsule    Sig: Take 1 capsule (50,000 Units total) by mouth every 7 (seven) days.    Dispense:  4 capsule    Refill:  0     1. Vitamin D deficiency Not at goal. Current vitamin D is 24.3, tested on 02/10/2020. Optimal goal > 50 ng/dL.  She is taking vitamin D 50,000 IU weekly.  Plan: Continue to take prescription Vitamin D @50 ,000 IU every week as prescribed.  Follow-up for routine testing of Vitamin D, at least 2-3 times per year to avoid over-replacement.    - Refill Vitamin D, Ergocalciferol, (DRISDOL) 1.25 MG (50000 UNIT) CAPS capsule; Take 1 capsule (50,000 Units total) by mouth every 7 (seven) days.  Dispense: 4 capsule; Refill: 0  2. At risk for malnutrition Raven Bauer was given extensive malnutrition prevention education and counseling today of  more than 8 minutes.  Counseled her that malnutrition refers to inappropriate nutrients or not the right balance of nutrients for optimal health.  Discussed with Raven Bauer that it is absolutely possible to be malnourished but yet obese.  Risk factors, including but not limited to, inappropriate dietary choices, difficulty with obtaining food due to physical or financial limitations, and various physical and mental health conditions were reviewed with Raven Bauer.   3. Class 1 obesity with serious comorbidity and body mass index (BMI) of 30.0 to 30.9 in adult, unspecified obesity type  Course: [name] is currently in the action stage of change. As such, her goal is to continue with weight loss efforts.   Nutrition goals: She has agreed to the Category 2 Plan.   Exercise goals: Increase as tolerated.  Behavioral modification strategies: meal planning and cooking strategies, keeping healthy foods in the home, better snacking choices and planning for success.  Raven Bauer has agreed to follow-up with our clinic in 2-2.5 weeks. She was informed of the importance of frequent follow-up visits to maximize her success with intensive lifestyle modifications for her multiple health conditions.   Objective:   Blood pressure 120/86, pulse 74, temperature 98 F (36.7 C), height 5\' 8"  (1.727 m), weight 199 lb (90.3 kg), SpO2 100 %. Body mass index is 30.26 kg/m.  General: Cooperative, alert, well developed, in no acute distress. HEENT: Conjunctivae and lids unremarkable.  Cardiovascular: Regular rhythm.  Lungs: Normal work of breathing. Neurologic: No focal deficits.   Lab Results  Component Value Date   CREATININE 0.76 02/10/2020   BUN 7 02/10/2020   NA 140 02/10/2020   K 4.5 02/10/2020   CL 101 02/10/2020   CO2 24 02/10/2020   Lab Results  Component Value Date   ALT 20 02/10/2020   AST 20 02/10/2020   ALKPHOS 77 02/10/2020   BILITOT 0.3 02/10/2020   Lab Results  Component Value Date    HGBA1C 5.9 (H) 02/10/2020   HGBA1C 5.8 08/07/2019   HGBA1C 5.6 03/12/2019   HGBA1C 5.8 06/09/2016   HGBA1C 5.7 01/21/2016   Lab Results  Component Value Date   INSULIN 9.0 02/10/2020   Lab Results  Component Value Date   TSH 1.680 02/10/2020   Lab Results  Component Value Date   CHOL 197 02/10/2020   HDL 45 02/10/2020   LDLCALC 134 (H) 02/10/2020   TRIG 98 02/10/2020   CHOLHDL 4.4 02/10/2020   Lab Results  Component Value Date   WBC 4.1 02/10/2020   HGB 12.1 02/10/2020   HCT 36.2 02/10/2020   MCV 90 02/10/2020   PLT 369 02/10/2020   Attestation Statements:   Reviewed by clinician on day of visit: allergies, medications, problem list, medical history, surgical history, family history, social history, and previous encounter notes.  I, Insurance claims handler, CMA, am acting as Energy manager for [employer], DO.  I have reviewed the above documentation for accuracy and completeness, and I agree with the above. Carlye Grippe, D.O.  The 21st Century Cures Act was signed into law in 2016 which includes the topic of electronic health records.  This provides immediate access to information in MyChart.  This includes consultation notes, operative notes, office notes, lab results and pathology reports.  If you have any questions about what you read please let us know at your next visit so we can discuss your concerns and take corrective action if need be.  We are right here with you.

## 2020-03-29 ENCOUNTER — Ambulatory Visit (INDEPENDENT_AMBULATORY_CARE_PROVIDER_SITE_OTHER): Payer: 59 | Admitting: Family Medicine

## 2020-04-08 ENCOUNTER — Ambulatory Visit (INDEPENDENT_AMBULATORY_CARE_PROVIDER_SITE_OTHER): Payer: 59 | Admitting: Adult Health

## 2020-05-05 ENCOUNTER — Ambulatory Visit: Payer: 59 | Admitting: Family Medicine

## 2020-05-13 ENCOUNTER — Ambulatory Visit: Payer: 59 | Admitting: Primary Care

## 2020-05-19 ENCOUNTER — Ambulatory Visit: Payer: 59 | Admitting: Family Medicine

## 2020-07-11 IMAGING — MR MRI HEAD WITHOUT AND WITH CONTRAST
12 series · 48 of 48 positions shown · IV contrast (multihance)
Comparison: Head CT 05/30/2007

CLINICAL DATA: Constant headache worst in the left frontal and
temporal region with blurred vision. Duration of symptoms 3 days.

Creatinine was obtained on site at [HOSPITAL] at [HOSPITAL].
Results: Creatinine 0.8 mg/dL.
EXAM:
MRI HEAD WITHOUT AND WITH CONTRAST
TECHNIQUE: Multiplanar, multiecho pulse sequences of the brain and surrounding
structures were obtained without and with intravenous contrast.
CONTRAST:  17mL MULTIHANCE GADOBENATE DIMEGLUMINE 529 MG/ML IV SOLN

[Series 2: T1 · sagittal · 5.0mm · 0.45mm/px · 2 of 21 slices shown]
[im 1/21]
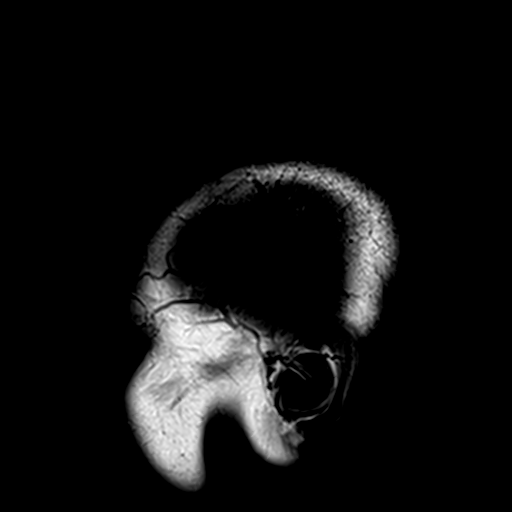
[im 21/21]
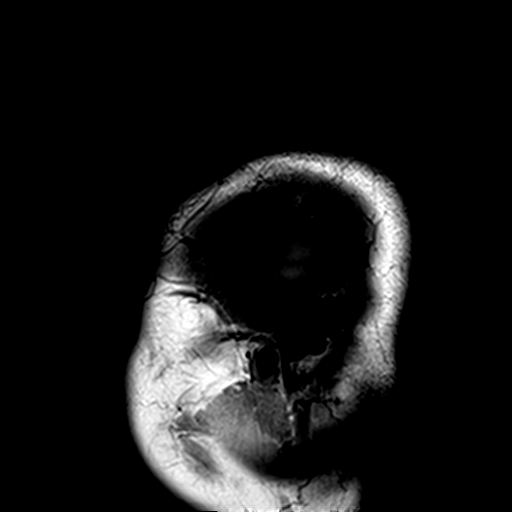

[Series 3: DWI · axial · 3.0mm · 1.80mm/px · z∈[-34,+113]mm · 7 of 100 slices shown (1 of 4)]
[im 1/100]
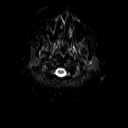
[im 17/100]
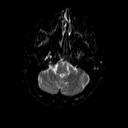
[im 34/100]
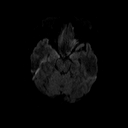
[im 50/100]
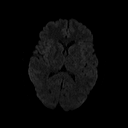
[im 67/100]
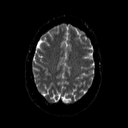
[im 83/100]
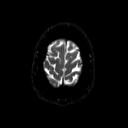
[im 100/100]
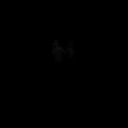

[Series 4: DWI · axial · 3.0mm · 1.80mm/px · z∈[-34,+113]mm · 3 of 50 slices shown (2 of 4)]
[im 1/50]
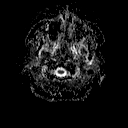
[im 25/50]
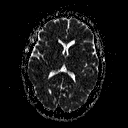
[im 50/50]
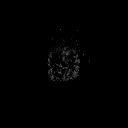

[Series 5: DWI · coronal · 5.0mm · 1.80mm/px · 5 of 69 slices shown (3 of 4)]
[im 1/69]
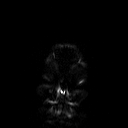
[im 18/69]
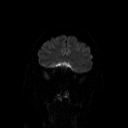
[im 35/69]
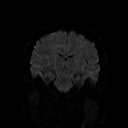
[im 52/69]
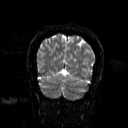
[im 69/69]
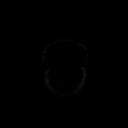

[Series 6: DWI · coronal · 5.0mm · 1.80mm/px · 2 of 36 slices shown (4 of 4)]
[im 1/36]
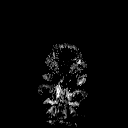
[im 36/36]
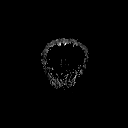

[Series 7: T2 · axial · 5.0mm · 0.51mm/px · z∈[-38,+103]mm · 2 of 22 slices shown (1 of 2)]
[im 1/22]
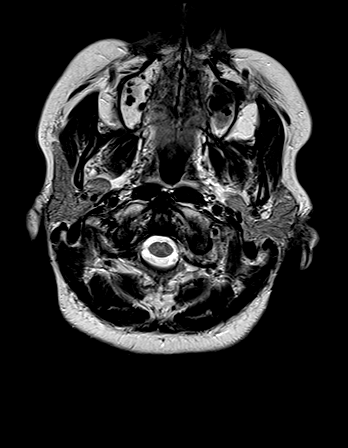
[im 22/22]
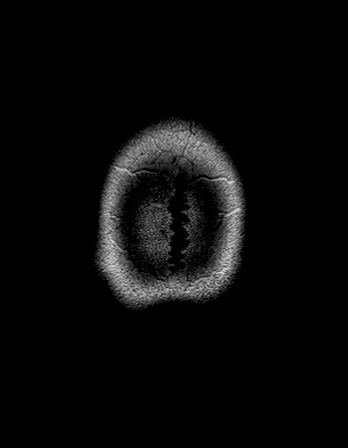

[Series 8: FLAIR · axial · 3.0mm · 0.45mm/px · 1 of 15 slices shown]
[im 1/15]
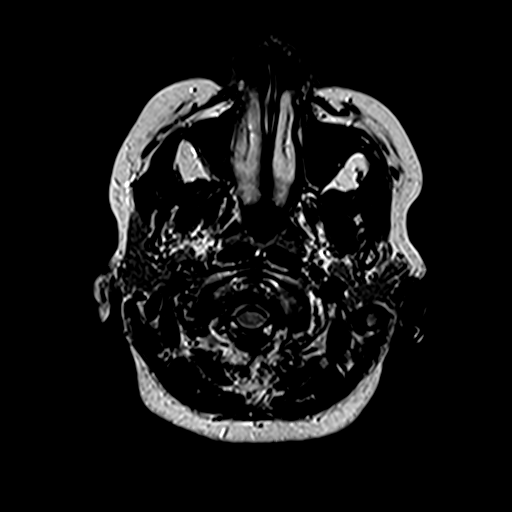

[Series 10: swi_images · axial · 4.0mm · 0.90mm/px · z∈[-45,+95]mm · 2 of 36 slices shown]
[im 1/36]
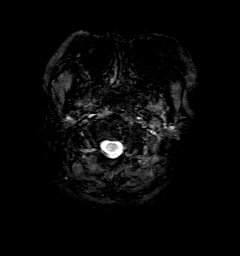
[im 36/36]
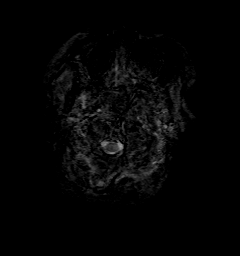

[Series 11: t1_mpr_tra · axial · 1.0mm · 0.75mm/px · z∈[-42,+101]mm · 10 of 144 slices shown (1 of 2)]
[im 1/144]
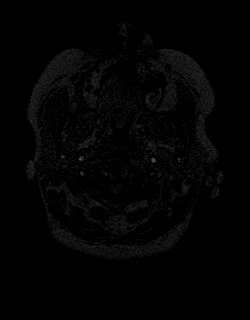
[im 16/144]
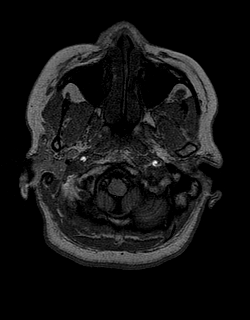
[im 32/144]
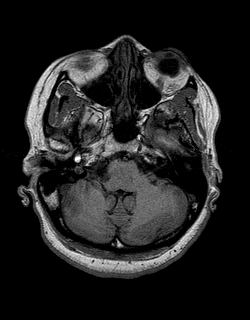
[im 48/144]
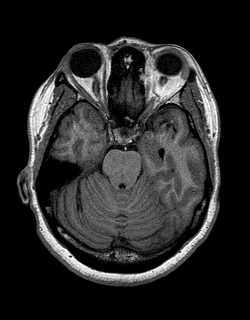
[im 64/144]
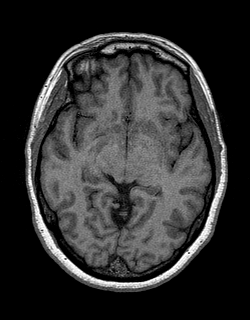
[im 80/144]
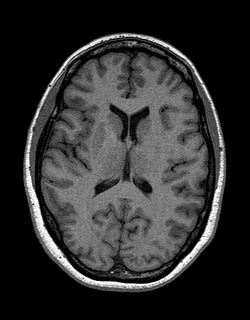
[im 96/144]
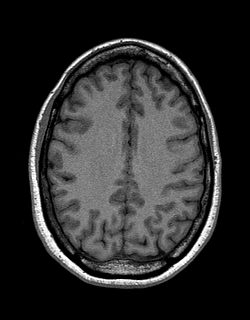
[im 112/144]
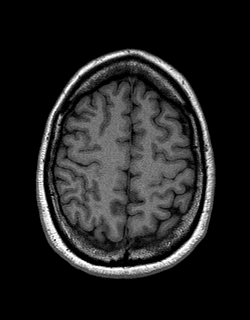
[im 128/144]
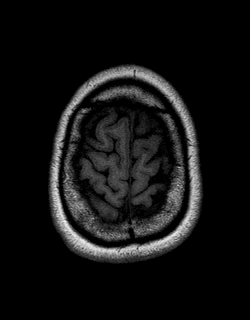
[im 144/144]
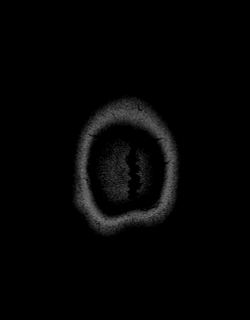

[Series 12: T2 · coronal · 5.0mm · 0.45mm/px · 2 of 29 slices shown (2 of 2)]
[im 1/29]
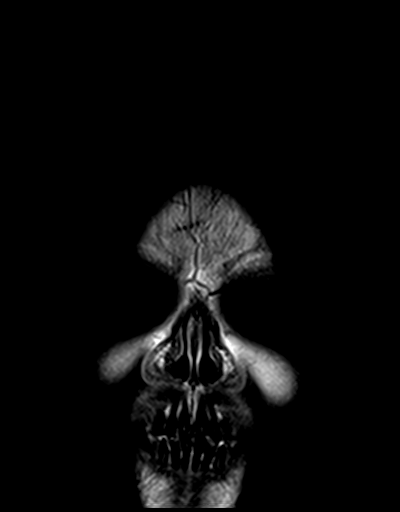
[im 29/29]
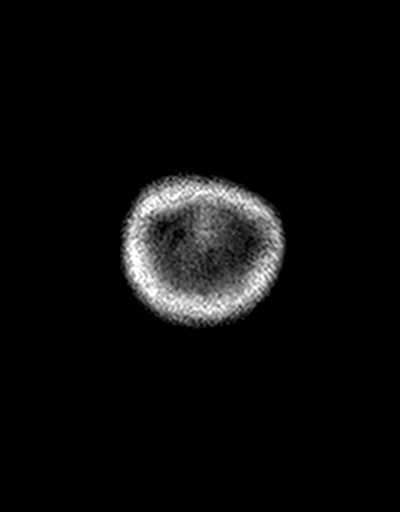

[Series 13: t1_mpr_tra · axial · 1.0mm · 0.75mm/px · z∈[-42,+101]mm · 10 of 144 slices shown (2 of 2)]
[im 1/144]
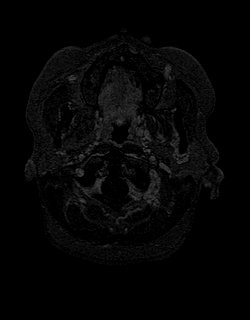
[im 16/144]
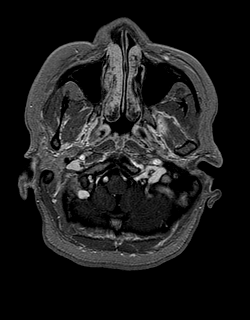
[im 32/144]
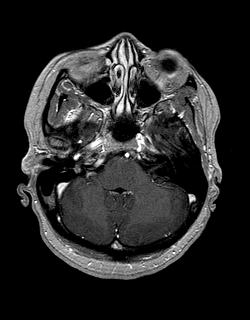
[im 48/144]
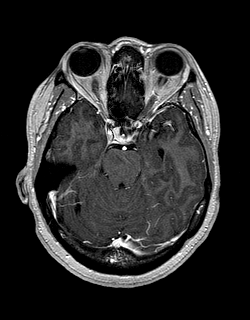
[im 64/144]
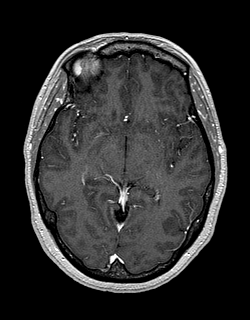
[im 80/144]
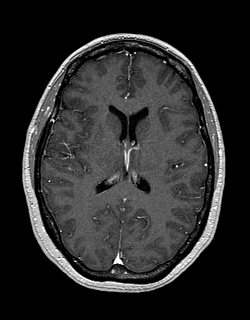
[im 96/144]
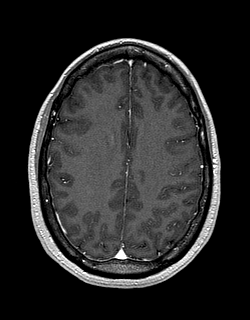
[im 112/144]
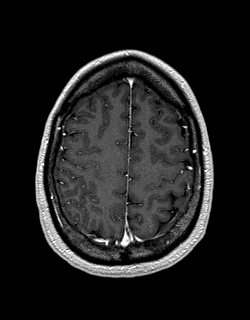
[im 128/144]
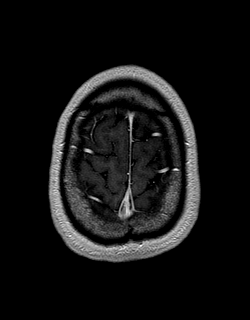
[im 144/144]
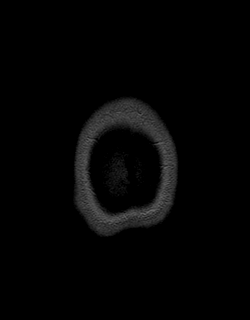

[Series 14: post cor · coronal · 5.0mm · 0.45mm/px · 2 of 29 slices shown]
[im 1/29]
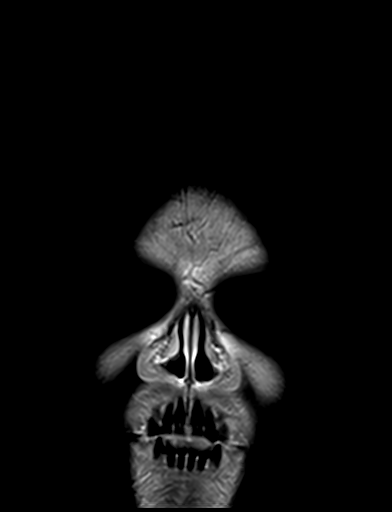
[im 29/29]
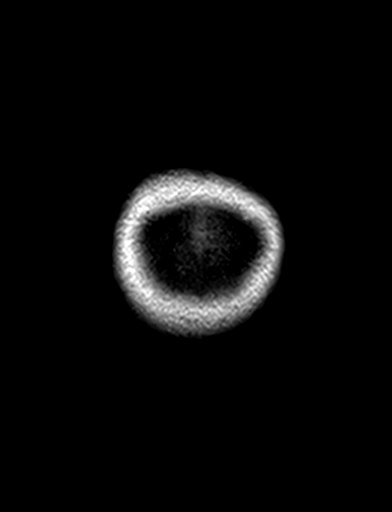

[48 of 48 positions shown; findings below may reference images not displayed]

FINDINGS: Brain: The brain has a normal appearance without evidence of
malformation, atrophy, old or acute small or large vessel
infarction, mass lesion, hemorrhage, hydrocephalus or extra-axial
collection. After contrast administration, no abnormal enhancement
occurs.

Vascular: Major vessels at the base of the brain show flow. Venous
sinuses appear patent.

Skull and upper cervical spine: Normal.

Sinuses/Orbits: Clear/normal.

Other: None significant.
IMPRESSION: Normal examination. No abnormality seen to explain the clinical
presentation.

## 2020-09-10 LAB — HM MAMMOGRAPHY

## 2020-09-21 ENCOUNTER — Encounter: Payer: Self-pay | Admitting: Primary Care

## 2020-09-21 NOTE — Progress Notes (Signed)
a 

## 2021-02-04 ENCOUNTER — Encounter: Payer: Self-pay | Admitting: Primary Care

## 2021-02-14 ENCOUNTER — Ambulatory Visit
Admission: RE | Admit: 2021-02-14 | Discharge: 2021-02-14 | Disposition: A | Discharge status: Home / Self Care | Payer: 59 | Source: Ambulatory Visit | Attending: Primary Care | Admitting: Primary Care

## 2021-02-14 ENCOUNTER — Other Ambulatory Visit (HOSPITAL_COMMUNITY)
Admission: RE | Admit: 2021-02-14 | Discharge: 2021-02-14 | Disposition: A | Discharge status: Home / Self Care | Payer: 59 | Source: Ambulatory Visit | Attending: Primary Care | Admitting: Primary Care

## 2021-02-14 ENCOUNTER — Other Ambulatory Visit: Payer: Self-pay

## 2021-02-14 ENCOUNTER — Ambulatory Visit (INDEPENDENT_AMBULATORY_CARE_PROVIDER_SITE_OTHER): Payer: 59 | Admitting: Primary Care

## 2021-02-14 ENCOUNTER — Encounter: Payer: Self-pay | Admitting: Primary Care

## 2021-02-14 ENCOUNTER — Ambulatory Visit (INDEPENDENT_AMBULATORY_CARE_PROVIDER_SITE_OTHER)
Admission: RE | Admit: 2021-02-14 | Discharge: 2021-02-14 | Disposition: A | Discharge status: Home / Self Care | Payer: 59 | Source: Ambulatory Visit | Attending: Primary Care | Admitting: Primary Care

## 2021-02-14 VITALS — BP 124/82 | HR 70 | Temp 98.0°F | Ht 68.0 in | Wt 197.0 lb

## 2021-02-14 DIAGNOSIS — M25562 Pain in left knee: Secondary | ICD-10-CM | POA: Diagnosis not present

## 2021-02-14 DIAGNOSIS — D508 Other iron deficiency anemias: Secondary | ICD-10-CM

## 2021-02-14 DIAGNOSIS — E7849 Other hyperlipidemia: Secondary | ICD-10-CM | POA: Diagnosis not present

## 2021-02-14 DIAGNOSIS — Z124 Encounter for screening for malignant neoplasm of cervix: Secondary | ICD-10-CM | POA: Insufficient documentation

## 2021-02-14 DIAGNOSIS — G43909 Migraine, unspecified, not intractable, without status migrainosus: Secondary | ICD-10-CM

## 2021-02-14 DIAGNOSIS — M25561 Pain in right knee: Secondary | ICD-10-CM

## 2021-02-14 DIAGNOSIS — R7303 Prediabetes: Secondary | ICD-10-CM

## 2021-02-14 DIAGNOSIS — L659 Nonscarring hair loss, unspecified: Secondary | ICD-10-CM | POA: Diagnosis not present

## 2021-02-14 DIAGNOSIS — Z1159 Encounter for screening for other viral diseases: Secondary | ICD-10-CM | POA: Diagnosis not present

## 2021-02-14 DIAGNOSIS — E559 Vitamin D deficiency, unspecified: Secondary | ICD-10-CM | POA: Diagnosis not present

## 2021-02-14 DIAGNOSIS — Z0001 Encounter for general adult medical examination with abnormal findings: Secondary | ICD-10-CM | POA: Diagnosis not present

## 2021-02-14 DIAGNOSIS — R519 Headache, unspecified: Secondary | ICD-10-CM

## 2021-02-14 DIAGNOSIS — G8929 Other chronic pain: Secondary | ICD-10-CM | POA: Diagnosis not present

## 2021-02-14 DIAGNOSIS — E538 Deficiency of other specified B group vitamins: Secondary | ICD-10-CM | POA: Diagnosis not present

## 2021-02-14 NOTE — Assessment & Plan Note (Signed)
Chronic, occurring every 3-6 months.  Continue to monitor.

## 2021-02-14 NOTE — Assessment & Plan Note (Signed)
Declines influenza vaccine. Other vaccines UTD. Pap smear due, completed today. Colonoscopy UTD, due 2031 Mammogram UTD.  Discussed the importance of a healthy diet and regular exercise in order for weight loss, and to reduce the risk of further co-morbidity.  Exam today as noted. Labs pending.

## 2021-02-14 NOTE — Assessment & Plan Note (Signed)
Checking B12 level.  °

## 2021-02-14 NOTE — Assessment & Plan Note (Signed)
Repeat vitamin D level pending. Continue 1000 IU vitamin D daily.
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
## 2021-02-14 NOTE — Assessment & Plan Note (Signed)
Chronic, evident on exam today.  Checking labs today. Recommended dermatology evaluation. She will call to schedule.

## 2021-02-14 NOTE — Patient Instructions (Addendum)
Stop by the lab and xray prior to leaving today. I will notify you of your results once received.   Consider seeing dermatology for hair loss.   Discuss your flashing symptoms with your eye doctor.   It was a pleasure to see you today!  Preventive Care 71-49 Years Old, Female Preventive care refers to lifestyle choices and visits with your health care provider that can promote health and wellness. Preventive care visits are also called wellness exams. What can I expect for my preventive care visit? Counseling Your health care provider may ask you questions about your: Medical history, including: Past medical problems. Family medical history. Pregnancy history. Current health, including: Menstrual cycle. Method of birth control. Emotional well-being. Home life and relationship well-being. Sexual activity and sexual health. Lifestyle, including: Alcohol, nicotine or tobacco, and drug use. Access to firearms. Diet, exercise, and sleep habits. Work and work Statistician. Sunscreen use. Safety issues such as seatbelt and bike helmet use. Physical exam Your health care provider will check your: Height and weight. These may be used to calculate your BMI (body mass index). BMI is a measurement that tells if you are at a healthy weight. Waist circumference. This measures the distance around your waistline. This measurement also tells if you are at a healthy weight and may help predict your risk of certain diseases, such as type 2 diabetes and high blood pressure. Heart rate and blood pressure. Body temperature. Skin for abnormal spots. What immunizations do I need? Vaccines are usually given at various ages, according to a schedule. Your health care provider will recommend vaccines for you based on your age, medical history, and lifestyle or other factors, such as travel or where you work. What tests do I need? Screening Your health care provider may recommend screening tests for  certain conditions. This may include: Lipid and cholesterol levels. Diabetes screening. This is done by checking your blood sugar (glucose) after you have not eaten for a while (fasting). Pelvic exam and Pap test. Hepatitis B test. Hepatitis C test. HIV (human immunodeficiency virus) test. STI (sexually transmitted infection) testing, if you are at risk. Lung cancer screening. Colorectal cancer screening. Mammogram. Talk with your health care provider about when you should start having regular mammograms. This may depend on whether you have a family history of breast cancer. BRCA-related cancer screening. This may be done if you have a family history of breast, ovarian, tubal, or peritoneal cancers. Bone density scan. This is done to screen for osteoporosis. Talk with your health care provider about your test results, treatment options, and if necessary, the need for more tests. Follow these instructions at home: Eating and drinking  Eat a diet that includes fresh fruits and vegetables, whole grains, lean protein, and low-fat dairy products. Take vitamin and mineral supplements as recommended by your health care provider. Do not drink alcohol if: Your health care provider tells you not to drink. You are pregnant, may be pregnant, or are planning to become pregnant. If you drink alcohol: Limit how much you have to 0-1 drink a day. Know how much alcohol is in your drink. In the [place] one drink equals one 12 oz bottle of beer (355 mL), one 5 oz glass of wine (148 mL), or one 1 oz glass of hard liquor (44 mL). Lifestyle Brush your teeth every morning and night with fluoride toothpaste. Floss one time each day. Exercise for at least 30 minutes 5 or more days each week. Do not use any products that  contain nicotine or tobacco. These products include cigarettes, chewing tobacco, and vaping devices, such as e-cigarettes. If you need help quitting, ask your health care provider. Do not use  drugs. If you are sexually active, practice safe sex. Use a condom or other form of protection to prevent STIs. If you do not wish to become pregnant, use a form of birth control. If you plan to become pregnant, see your health care provider for a prepregnancy visit. Take aspirin only as told by your health care provider. Make sure that you understand how much to take and what form to take. Work with your health care provider to find out whether it is safe and beneficial for you to take aspirin daily. Find healthy ways to manage stress, such as: Meditation, yoga, or listening to music. Journaling. Talking to a trusted person. Spending time with friends and family. Minimize exposure to UV radiation to reduce your risk of skin cancer. Safety Always wear your seat belt while driving or riding in a vehicle. Do not drive: If you have been drinking alcohol. Do not ride with someone who has been drinking. When you are tired or distracted. While texting. If you have been using any mind-altering substances or drugs. Wear a helmet and other protective equipment during sports activities. If you have firearms in your house, make sure you follow all gun safety procedures. Seek help if you have been physically or sexually abused. What's next? Visit your health care provider once a year for an annual wellness visit. Ask your health care provider how often you should have your eyes and teeth checked. Stay up to date on all vaccines. This information is not intended to replace advice given to you by your health care provider. Make sure you discuss any questions you have with your health care provider. Document Revised: 06/30/2020 Document Reviewed: 06/30/2020 Elsevier Patient Education  [employer].

## 2021-02-14 NOTE — Assessment & Plan Note (Signed)
Unclear if her "flashing sensation" is secondary to headaches or a visual problem.   She will see her eye doctor soon and will mention these symptoms.   Consider headache prevention treatment. Consider CT head if warranted.   She will update.

## 2021-02-14 NOTE — Assessment & Plan Note (Addendum)
Likely osteoarthritis.  Checking xrays of both knees today.  Checking labs to rule out gout.  Consider orthopedics vs PT.  Consider Voltaren Gel.  Await results.

## 2021-02-14 NOTE — Assessment & Plan Note (Signed)
Discussed the importance of a healthy diet and regular exercise in order for weight loss, and to reduce the risk of further co-morbidity. ? ?Repeat A1C pending. ?

## 2021-02-14 NOTE — Assessment & Plan Note (Signed)
Discussed the importance of a healthy diet and regular exercise in order for weight loss, and to reduce the risk of further co-morbidity.  Repeat lipid panel pending. 

## 2021-02-15 DIAGNOSIS — G8929 Other chronic pain: Secondary | ICD-10-CM

## 2021-02-15 DIAGNOSIS — M170 Bilateral primary osteoarthritis of knee: Secondary | ICD-10-CM

## 2021-02-15 LAB — CYTOLOGY - PAP
Comment: NEGATIVE
Diagnosis: NEGATIVE
High risk HPV: NEGATIVE

## 2021-02-15 LAB — COMPREHENSIVE METABOLIC PANEL
ALT: 12 U/L (ref 0–35)
AST: 16 U/L (ref 0–37)
Albumin: 4.4 g/dL (ref 3.5–5.2)
Alkaline Phosphatase: 51 U/L (ref 39–117)
BUN: 9 mg/dL (ref 6–23)
CO2: 30 mEq/L (ref 19–32)
Calcium: 9.1 mg/dL (ref 8.4–10.5)
Chloride: 103 mEq/L (ref 96–112)
Creatinine, Ser: 0.7 mg/dL (ref 0.40–1.20)
GFR: 102.2 mL/min (ref >60.00)
Glucose, Bld: 88 mg/dL (ref 70–99)
Potassium: 4.4 mEq/L (ref 3.5–5.1)
Sodium: 137 mEq/L (ref 135–145)
Total Bilirubin: 0.4 mg/dL (ref 0.2–1.2)
Total Protein: 7.2 g/dL (ref 6.0–8.3)

## 2021-02-15 LAB — CBC
HCT: 36.5 % (ref 36.0–46.0)
Hemoglobin: 12.1 g/dL (ref 12.0–15.0)
MCHC: 33.1 g/dL (ref 30.0–36.0)
MCV: 90 fl (ref 78.0–100.0)
Platelets: 425 10*3/uL — ABNORMAL HIGH (ref 150.0–400.0)
RBC: 4.06 Mil/uL (ref 3.87–5.11)
RDW: 13.2 % (ref 11.5–15.5)
WBC: 4.2 10*3/uL (ref 4.0–10.5)

## 2021-02-15 LAB — LIPID PANEL
Cholesterol: 195 mg/dL (ref 0–200)
HDL: 53 mg/dL (ref >39.00)
LDL Cholesterol: 125 mg/dL — ABNORMAL HIGH (ref 0–99)
NonHDL: 141.89
Total CHOL/HDL Ratio: 4
Triglycerides: 84 mg/dL (ref 0.0–149.0)
VLDL: 16.8 mg/dL (ref 0.0–40.0)

## 2021-02-15 LAB — TSH: TSH: 0.79 u[IU]/mL (ref 0.35–5.50)

## 2021-02-15 LAB — VITAMIN D 25 HYDROXY (VIT D DEFICIENCY, FRACTURES): VITD: 53.54 ng/mL (ref 30.00–100.00)

## 2021-02-15 LAB — HEMOGLOBIN A1C: Hgb A1c MFr Bld: 5.8 % (ref 4.6–6.5)

## 2021-02-15 LAB — VITAMIN B12: Vitamin B-12: 905 pg/mL (ref 211–911)

## 2021-02-15 LAB — HEPATITIS C ANTIBODY
Hepatitis C Ab: NONREACTIVE
SIGNAL TO CUT-OFF: <0.02 (ref <1.00)

## 2021-02-15 LAB — URIC ACID: Uric Acid, Serum: 3.7 mg/dL (ref 2.4–7.0)

## 2021-02-16 MED ORDER — DICLOFENAC SODIUM 1 % EX GEL: 2.0000 g | Freq: Three times a day (TID) | CUTANEOUS | 0 refills | Status: DC | PRN

## 2021-02-23 ENCOUNTER — Encounter: Payer: 59 | Admitting: Primary Care

## 2021-03-04 ENCOUNTER — Ambulatory Visit (INDEPENDENT_AMBULATORY_CARE_PROVIDER_SITE_OTHER): Payer: 59 | Admitting: Orthopaedic Surgery

## 2021-03-04 ENCOUNTER — Other Ambulatory Visit: Payer: Self-pay

## 2021-03-04 VITALS — Ht 68.0 in | Wt 197.0 lb

## 2021-03-04 DIAGNOSIS — M1712 Unilateral primary osteoarthritis, left knee: Secondary | ICD-10-CM | POA: Diagnosis not present

## 2021-03-04 DIAGNOSIS — M1711 Unilateral primary osteoarthritis, right knee: Secondary | ICD-10-CM
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
## 2021-05-04 ENCOUNTER — Emergency Department
Admission: EM | Admit: 2021-05-04 | Discharge: 2021-05-04 | Disposition: A | Discharge status: Home / Self Care | Payer: 59 | Attending: Emergency Medicine | Admitting: Emergency Medicine

## 2021-05-04 ENCOUNTER — Encounter: Payer: Self-pay | Admitting: Emergency Medicine

## 2021-05-04 ENCOUNTER — Other Ambulatory Visit: Payer: Self-pay

## 2021-05-04 ENCOUNTER — Ambulatory Visit (INDEPENDENT_AMBULATORY_CARE_PROVIDER_SITE_OTHER): Payer: 59

## 2021-05-04 ENCOUNTER — Emergency Department: Payer: 59

## 2021-05-04 ENCOUNTER — Ambulatory Visit
Admission: EM | Admit: 2021-05-04 | Discharge: 2021-05-04 | Disposition: A | Discharge status: Other Acute Inpatient Hospital | Payer: 59 | Attending: Urgent Care | Admitting: Urgent Care

## 2021-05-04 DIAGNOSIS — R109 Unspecified abdominal pain: Secondary | ICD-10-CM

## 2021-05-04 DIAGNOSIS — R079 Chest pain, unspecified: Secondary | ICD-10-CM | POA: Diagnosis not present

## 2021-05-04 DIAGNOSIS — R0789 Other chest pain: Secondary | ICD-10-CM | POA: Diagnosis present

## 2021-05-04 DIAGNOSIS — R100 Acute abdomen: Secondary | ICD-10-CM | POA: Diagnosis present

## 2021-05-04 DIAGNOSIS — R1030 Lower abdominal pain, unspecified: Secondary | ICD-10-CM | POA: Diagnosis not present

## 2021-05-04 DIAGNOSIS — R140 Abdominal distension (gaseous): Secondary | ICD-10-CM | POA: Insufficient documentation

## 2021-05-04 DIAGNOSIS — R110 Nausea: Secondary | ICD-10-CM | POA: Insufficient documentation

## 2021-05-04 LAB — BASIC METABOLIC PANEL
Anion gap: 7 (ref 5–15)
BUN: 10 mg/dL (ref 6–20)
CO2: 26 mmol/L (ref 22–32)
Calcium: 8.9 mg/dL (ref 8.9–10.3)
Chloride: 103 mmol/L (ref 98–111)
Creatinine, Ser: 0.72 mg/dL (ref 0.44–1.00)
GFR, Estimated: >60 mL/min (ref >60)
Glucose, Bld: 112 mg/dL — ABNORMAL HIGH (ref 70–99)
Potassium: 4.2 mmol/L (ref 3.5–5.1)
Sodium: 136 mmol/L (ref 135–145)

## 2021-05-04 LAB — URINALYSIS, ROUTINE W REFLEX MICROSCOPIC
Bilirubin Urine: NEGATIVE
Glucose, UA: NEGATIVE mg/dL
Hgb urine dipstick: NEGATIVE
Ketones, ur: NEGATIVE mg/dL
Leukocytes,Ua: NEGATIVE
Nitrite: NEGATIVE
Protein, ur: NEGATIVE mg/dL
Specific Gravity, Urine: 1.02 (ref 1.005–1.030)
pH: 7 (ref 5.0–8.0)

## 2021-05-04 LAB — TROPONIN I (HIGH SENSITIVITY)
Troponin I (High Sensitivity): 3 ng/L (ref <18)
Troponin I (High Sensitivity): 3 ng/L (ref <18)

## 2021-05-04 LAB — CBC
HCT: 36.9 % (ref 36.0–46.0)
Hemoglobin: 11.8 g/dL — ABNORMAL LOW (ref 12.0–15.0)
MCH: 29.4 pg (ref 26.0–34.0)
MCHC: 32 g/dL (ref 30.0–36.0)
MCV: 91.8 fL (ref 80.0–100.0)
Platelets: 369 10*3/uL (ref 150–400)
RBC: 4.02 MIL/uL (ref 3.87–5.11)
RDW: 12.6 % (ref 11.5–15.5)
WBC: 5.1 10*3/uL (ref 4.0–10.5)
nRBC: 0 % (ref 0.0–0.2)

## 2021-05-04 LAB — LIPASE, BLOOD: Lipase: 29 U/L (ref 11–51)

## 2021-05-04 LAB — HEPATIC FUNCTION PANEL
ALT: 15 U/L (ref 0–44)
AST: 17 U/L (ref 15–41)
Albumin: 4.3 g/dL (ref 3.5–5.0)
Alkaline Phosphatase: 49 U/L (ref 38–126)
Bilirubin, Direct: <0.1 mg/dL (ref 0.0–0.2)
Total Bilirubin: 0.6 mg/dL (ref 0.3–1.2)
Total Protein: 7.8 g/dL (ref 6.5–8.1)

## 2021-05-04 LAB — PREGNANCY, URINE: Preg Test, Ur: NEGATIVE

## 2021-05-04 MED ORDER — IBUPROFEN 800 MG PO TABS: 800.0000 mg | ORAL_TABLET | Freq: Once | ORAL | Status: DC

## 2021-05-04 NOTE — ED Triage Notes (Signed)
Pt here with burning, sharp pain in central and right side of chest starting this morning. That has since resolved, but the pain has moved to LUQ and LLQ with nausea, bloating, pressure and sharp pain. Pt reports she usually has regular bowel movements, but LBM was different than usual.  ?

## 2021-05-04 NOTE — ED Provider Notes (Signed)
? ?[facility] ?Provider Note ? ? ? Event Date/Time  ? First MD Initiated Contact with Patient 05/04/21 1506   ?  (approximate) ? ? ?History  ? ?Chief Complaint ?Abdominal Pain and Chest Pain ? ? ?HPI ? ?Raven Bauer is a 51 y.o. female with past medical history of migraines, thrombocytopenia, and anemia who presents to the ED complaining of chest and abdominal pain.  Patient reports that she was driving to work when she had sudden onset of sharp pain in the left side of her chest.  She initially thought it was indigestion, however pain increased in severity while she was at work, also began to move downwards into her abdomen.  She states that affected both sides of her lower abdomen, where it felt like severe cramping.  She had some nausea with this, but did not vomit and denies any changes in her bowel movements.  She denies any fevers, cough, or difficulty breathing.  She does state that she has been urinating more frequently today, denies any dysuria or, hematuria, or flank pain.  Pain in her chest has now resolved, she does complain of mild ongoing discomfort in her bilateral lower quadrants, described as 1 out of 10. ? ?  ? ? ?Physical Exam  ? ?Triage Vital Signs: ?ED Triage Vitals  ?Enc Vitals Group  ?   BP 05/04/21 1219 131/85  ?   Pulse Rate 05/04/21 1219 66  ?   Resp 05/04/21 1219 17  ?   Temp 05/04/21 1219 98.3 ?F (36.8 ?C)  ?   Temp Source 05/04/21 1219 Oral  ?   SpO2 05/04/21 1219 98 %  ?   Weight 05/04/21 1220 197 lb 1.5 oz (89.4 kg)  ?   Height 05/04/21 1220 5\' 8"  (1.727 m)  ?   Head Circumference --   ?   Peak Flow --   ?   Pain Score 05/04/21 1220 5  ?   Pain Loc --   ?   Pain Edu? --   ?   Excl. in GC? --   ? ? ?Most recent vital signs: ?Vitals:  ? 05/04/21 1219 05/04/21 1641  ?BP: 131/85 124/78  ?Pulse: 66 71  ?Resp: 17 17  ?Temp: 98.3 ?F (36.8 ?C)   ?SpO2: 98% 100%  ? ? ?Constitutional: Alert and oriented. ?Eyes: Conjunctivae are normal. ?Head: Atraumatic. ?Nose: No  congestion/rhinnorhea. ?Mouth/Throat: Mucous membranes are moist.  ?Cardiovascular: Normal rate, regular rhythm. Grossly normal heart sounds.  2+ radial pulses bilaterally. ?Respiratory: Normal respiratory effort.  No retractions. Lungs CTAB. ?Gastrointestinal: Soft and nontender. No distention. ?Musculoskeletal: No lower extremity tenderness nor edema.  ?Neurologic:  Normal speech and language. No gross focal neurologic deficits are appreciated. ? ? ? ?ED Results / Procedures / Treatments  ? ?Labs ?(all labs ordered are listed, but only abnormal results are displayed) ?Labs Reviewed  ?BASIC METABOLIC PANEL - Abnormal; Notable for the following components:  ?    Result Value  ? Glucose, Bld 112 (*)   ? All other components within normal limits  ?CBC - Abnormal; Notable for the following components:  ? Hemoglobin 11.8 (*)   ? All other components within normal limits  ?URINALYSIS, ROUTINE W REFLEX MICROSCOPIC - Abnormal; Notable for the following components:  ? Color, Urine YELLOW (*)   ? APPearance HAZY (*)   ? All other components within normal limits  ?LIPASE, BLOOD  ?HEPATIC FUNCTION PANEL  ?PREGNANCY, URINE  ?TROPONIN I (HIGH SENSITIVITY)  ?TROPONIN  I (HIGH SENSITIVITY)  ? ? ? ?EKG ? ?ED ECG REPORT ?Harriet Masson, the attending physician, personally viewed and interpreted this ECG. ? ? Date: 05/04/2021 ? EKG Time: 12:16 ? Rate: 62 ? Rhythm: normal sinus rhythm ? Axis: Normal ? Intervals:none ? ST&T Change: None ? ?RADIOLOGY ?Chest x-ray reviewed by me with no infiltrate, edema, or effusion. ? ?PROCEDURES: ? ?Critical Care performed: No ? ?Procedures ? ? ?MEDICATIONS ORDERED IN ED: ?Medications - No data to display ? ? ?IMPRESSION / MDM / ASSESSMENT AND PLAN / ED COURSE  ?I reviewed the triage vital signs and the nursing notes. ?             ?               ? ?51 y.o. female with past medical history of migraines, thrombocytopenia, and anemia who presents to the ED complaining of sudden onset discomfort in  the left side of her chest moving downwards into both lower quadrants of her abdomen. ? ?Differential diagnosis includes, but is not limited to, ACS, PE, pneumonia, pneumothorax, gastritis, GERD, pancreatitis, biliary colic, hepatitis, UTI, gastroenteritis. ? ?Patient nontoxic-appearing and in no acute distress, vital signs are unremarkable and she has a benign abdominal exam without tenderness.  Initial labs are reassuring, CBC without anemia or leukocytosis, BMP without AKI or electrolyte abnormality, troponin within normal limits.  Given acute onset of chest pain, we will check second set troponin but overall low suspicion for ACS.  We will add on LFTs and lipase but given resolving pain, hold off on CT scan of her abdomen.  Symptoms sound likely related to gastritis and different meter if work-up is unremarkable, patient would be appropriate for discharge home with PCP follow-up. ? ?LFTs and lipase are within normal limits, pregnancy testing is negative and urinalysis is unremarkable.  Patient states that pain is completely resolved at this time, repeat troponin also is negative and I doubt cardiac etiology for her symptoms.  She is appropriate for discharge home with PCP follow-up, was counseled to return to the ED for new worsening symptoms.  Patient agrees with plan. ? ?  ? ? ?FINAL CLINICAL IMPRESSION(S) / ED DIAGNOSES  ? ?Final diagnoses:  ?Atypical chest pain  ?Lower abdominal pain  ? ? ? ?Rx / DC Orders  ? ?ED Discharge Orders   ? ? None  ? ?  ? ? ? ?Note:  This document was prepared using Dragon voice recognition software and may include unintentional dictation errors. ?  ?Chesley Noon, MD ?05/04/21 1711 ? ?
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
## 2021-05-04 NOTE — ED Triage Notes (Signed)
Pt comes into the ED via POV c/o left side chest pain that started this morning.  Pt described the pain as a tightness.  Pt thought it was reflux so she went to work, and then the pain increased and started going into her upper stomach as well.  Pt denies any cardiac history. PT denies any SHOB or dizziness, but states she had some mild nausea.  Pt in NAD at this time with even and unlabored respirations.  ?

## 2021-05-04 NOTE — Discharge Instructions (Signed)
You are in need of a higher level of care than we can provide in the urgent care setting.  My concern is that you have an acute abdomen given your severe abdominal pain over the left side worse over the left lower quadrant.  This particular symptom requires further management through the emergency room and hospital including diagnostic tests like CT abdomen pelvis, blood work.  You will be evaluated by a different team there we will continue making these clinical decisions and helping you with your pain. ?

## 2021-08-24 ENCOUNTER — Encounter (INDEPENDENT_AMBULATORY_CARE_PROVIDER_SITE_OTHER): Payer: Self-pay

## 2021-09-23 LAB — HM MAMMOGRAPHY

## 2021-09-26 ENCOUNTER — Encounter: Payer: Self-pay | Admitting: Primary Care

## 2021-11-11 ENCOUNTER — Telehealth: Payer: Self-pay

## 2021-11-11 ENCOUNTER — Emergency Department: Payer: 59

## 2021-11-11 ENCOUNTER — Emergency Department
Admission: EM | Admit: 2021-11-11 | Discharge: 2021-11-11 | Disposition: A | Discharge status: Home / Self Care | Payer: 59 | Attending: Emergency Medicine | Admitting: Emergency Medicine

## 2021-11-11 ENCOUNTER — Other Ambulatory Visit: Payer: Self-pay

## 2021-11-11 DIAGNOSIS — R079 Chest pain, unspecified: Secondary | ICD-10-CM

## 2021-11-11 DIAGNOSIS — R0789 Other chest pain: Secondary | ICD-10-CM | POA: Insufficient documentation

## 2021-11-11 LAB — URINALYSIS, ROUTINE W REFLEX MICROSCOPIC
Bacteria, UA: NONE SEEN
Bilirubin Urine: NEGATIVE
Glucose, UA: NEGATIVE mg/dL
Ketones, ur: NEGATIVE mg/dL
Leukocytes,Ua: NEGATIVE
Nitrite: NEGATIVE
Protein, ur: NEGATIVE mg/dL
Specific Gravity, Urine: 1.004 — ABNORMAL LOW (ref 1.005–1.030)
pH: 7 (ref 5.0–8.0)

## 2021-11-11 LAB — BASIC METABOLIC PANEL
Anion gap: 8 (ref 5–15)
BUN: 10 mg/dL (ref 6–20)
CO2: 23 mmol/L (ref 22–32)
Calcium: 9.1 mg/dL (ref 8.9–10.3)
Chloride: 107 mmol/L (ref 98–111)
Creatinine, Ser: 0.65 mg/dL (ref 0.44–1.00)
GFR, Estimated: >60 mL/min (ref >60)
Glucose, Bld: 92 mg/dL (ref 70–99)
Potassium: 3.9 mmol/L (ref 3.5–5.1)
Sodium: 138 mmol/L (ref 135–145)

## 2021-11-11 LAB — CBC
HCT: 38 % (ref 36.0–46.0)
Hemoglobin: 12.2 g/dL (ref 12.0–15.0)
MCH: 29.3 pg (ref 26.0–34.0)
MCHC: 32.1 g/dL (ref 30.0–36.0)
MCV: 91.3 fL (ref 80.0–100.0)
Platelets: 392 10*3/uL (ref 150–400)
RBC: 4.16 MIL/uL (ref 3.87–5.11)
RDW: 12.9 % (ref 11.5–15.5)
WBC: 4.3 10*3/uL (ref 4.0–10.5)
nRBC: 0 % (ref 0.0–0.2)

## 2021-11-11 LAB — HEPATIC FUNCTION PANEL
ALT: 17 U/L (ref 0–44)
AST: 20 U/L (ref 15–41)
Albumin: 4.4 g/dL (ref 3.5–5.0)
Alkaline Phosphatase: 60 U/L (ref 38–126)
Bilirubin, Direct: <0.1 mg/dL (ref 0.0–0.2)
Total Bilirubin: 0.6 mg/dL (ref 0.3–1.2)
Total Protein: 8.1 g/dL (ref 6.5–8.1)

## 2021-11-11 LAB — TROPONIN I (HIGH SENSITIVITY)
Troponin I (High Sensitivity): 3 ng/L (ref <18)
Troponin I (High Sensitivity): 4 ng/L (ref <18)

## 2021-11-11 LAB — TSH: TSH: 1.306 u[IU]/mL (ref 0.350–4.500)

## 2021-11-11 LAB — POC URINE PREG, ED: Preg Test, Ur: NEGATIVE

## 2021-11-11 LAB — PREGNANCY, URINE: Preg Test, Ur: NEGATIVE

## 2021-11-11 NOTE — Telephone Encounter (Signed)
Noted, will await ED notes. 

## 2021-11-11 NOTE — ED Provider Notes (Signed)
[facility] Provider Note    Event Date/Time   First MD Initiated Contact with Patient 11/11/21 1008     (approximate)   History   Chest Pain   HPI Raven Bauer is a 51 y.o. female who reports she has been taking care of her mother for the last couple days.  On Wednesday she had a headache which developed gradually and got to be fairly severe.  It lasted yesterday but is now essentially gone.  She had some vomiting with it on Wednesday.  She also developed some chest tightness on Wednesday this is coming gone since then and she still having some chest tightness now which comes and goes last for few minutes at a time.  Its not just for a few seconds and has not for several hours.  Pain does not radiate.  Is not associated with nausea or vomiting today or yesterday.  Difficult to tell about Wednesday with a headache.  It does not seem to be made worse with exertion. Patient does not smoke.  She has borderline diabetes.  Father had heart attack in his 18s and also has congestive failure.   Patient also reports she has been urinating about every 25 minutes.  She does not have a dysuria.  She is not going in small amounts frequently just a regular amount but every 25 minutes has been going on for several days at least.  Physical Exam   Triage Vital Signs: ED Triage Vitals [11/11/21 0930]  Enc Vitals Group     BP (!) 140/95     Pulse Rate 66     Resp 16     Temp 98.5 F (36.9 C)     Temp Source Oral     SpO2 99 %     Weight 200 lb (90.7 kg)     Height 5\' 7"  (1.702 m)     Head Circumference      Peak Flow      Pain Score 1     Pain Loc      Pain Edu?      Excl. in GC?     Most recent vital signs: Vitals:   11/11/21 1130 11/11/21 1245  BP: 125/85 115/88  Pulse: 67 71  Resp: 11 19  Temp:    SpO2: 99% 97%     General: Awake, no distress.  CV:  Good peripheral perfusion.  Heart regular rate and rhythm no audible murmurs Chest is  nontender Resp:  Normal effort.  Lungs are clear Abd:  No distention.  Soft and nontender Extremities with no edema   ED Results / Procedures / Treatments   Labs (all labs ordered are listed, but only abnormal results are displayed) Labs Reviewed  URINALYSIS, ROUTINE W REFLEX MICROSCOPIC - Abnormal; Notable for the following components:      Result Value   Color, Urine STRAW (*)    APPearance CLEAR (*)    Specific Gravity, Urine 1.004 (*)    Hgb urine dipstick SMALL (*)    All other components within normal limits  BLOOD GAS, VENOUS - Abnormal; Notable for the following components:   pH, Ven 7.46 (*)    pCO2, Ven 39 (*)    pO2, Ven <31 (*)    Acid-Base Excess 3.7 (*)    All other components within normal limits  POC URINE PREG, ED - Normal  BASIC METABOLIC PANEL  CBC  HEPATIC FUNCTION PANEL  TSH  PREGNANCY, URINE  TROPONIN  I (HIGH SENSITIVITY)  TROPONIN I (HIGH SENSITIVITY)     EKG  Chest x-ray read and interpreted by me shows normal sinus rhythm rate of 72 normal axis no acute disease   RADIOLOGY Chest x-ray read by radiology reviewed and interpreted by me shows no acute disease.  PROCEDURES:  Critical Care performed:   Procedures   MEDICATIONS ORDERED IN ED: Medications - No data to display   IMPRESSION / MDM / [place] / ED COURSE  I reviewed the triage vital signs and the nursing notes. ----------------------------------------- 1:22 PM on 11/11/2021 ----------------------------------------- Patient's urinalysis is normal her electrolytes are okay.  EKG looks good.  She is not tachycardic she is not running a fever she is not having any shortness of breath or pleuritic chest pain.  Troponins are negative.  I will let her go with some cardiology follow-up.  She does have family history of heart disease.  Differential diagnosis includes, but is not limited to, cardiac problems or pneumonia or anxiety or esophageal spasm are some of the possible  diagnoses  Patient's presentation is most consistent with acute presentation with potential threat to life or bodily function.  The patient is on the cardiac monitor to evaluate for evidence of arrhythmia and/or significant heart rate changes.  Have been seen    FINAL CLINICAL IMPRESSION(S) / ED DIAGNOSES   Final diagnoses:  Nonspecific chest pain     Rx / DC Orders   ED Discharge Orders          Ordered    Ambulatory referral to Cardiology       Comments: If you have not heard from the Cardiology office within the next 72 hours please call [phone].   11/11/21 1320             Note:  This document was prepared using Dragon voice recognition software and may include unintentional dictation errors.   Nena Polio, MD 11/11/21 1323

## 2021-11-11 NOTE — ED Notes (Signed)
Pt alert, NAD, calm, interactive, resps e/u, mentions intermittent mid chest pressure, no radiation, recent nausea 2d ago resolved, recent cold sx resolved, non-productive cough resolved. Also mentions occ bilateral heel pain, and some sob. Reports increased stress.

## 2021-11-11 NOTE — Telephone Encounter (Signed)
[facility] - Client TELEPHONE ADVICE RECORD [facility] Patient Name: Raven Bauer Gender: Female DOB: 03/21/1970 Age: 51 Y 2 M 29 D Return Phone Number: [phone] (Primary), [phone] (Secondary) Address: City/ State/ Zip: [place]  [address] Client [facility] - Client Client Site [facility] - Day Provider Alma Friendly - NP Contact Type Call Who Is Calling Patient / Member / Family / Caregiver Call Type Triage / Clinical Relationship To Patient Self Return Phone Number [phone] (Primary) Chief Complaint Headache Reason for Call Symptomatic / Request for [name] says that lately she has had headaches, discomfort on her chest, and fatigue. The only Sx right now is a headache. Secondary is work # [phone] ext [phone] Translation No Nurse Assessment Nurse: Durene Cal, RN, [place] Date/Time (Eastern Time): 11/11/2021 8:55:41 AM Confirm and document reason for call. If symptomatic, describe symptoms. ---Reports she has had a migraine for 3 days; improved some today 2(0-10) tightness started on Wednesday. Chest discomfort around 3am; intermittent. Constant chest discomfort 1(0-10) with intermittent 5(0-10). Does the patient have any new or worsening symptoms? ---Yes Will a triage be completed? ---Yes Related visit to physician within the last 2 weeks? ---N/A Does the PT have any chronic conditions? (i.e. diabetes, asthma, this includes High risk factors for pregnancy, etc.) ---Yes List chronic conditions. ---pre diabetic Is the patient pregnant or possibly pregnant? (Ask all females between the ages of 55-55) ---No Is this a behavioral health or substance abuse call? ---No Guidelines Guideline Title Affirmed Question Affirmed Notes Nurse Date/Time [name] Time) Chest Pain [1] Chest pain lasts > 5 minutes AND [2] age > 3 Durene Cal,  RN, [name] 11/11/2021 8:58:49 AM PLEASE NOTE: All timestamps contained within this report are represented as [place] Standard Time. CONFIDENTIALTY NOTICE: This fax transmission is intended only for the addressee. It contains information that is legally privileged, confidential or otherwise protected from use or disclosure. If you are not the intended recipient, you are strictly prohibited from reviewing, disclosing, copying using or disseminating any of this information or taking any action in reliance on or regarding this information. If you have received this fax in error, please notify us immediately by telephone so that we can arrange for its return to [place]. Phone: [phone], Toll-Free: [phone], Fax: [phone] Page: 2 of 2 Call Id: [id number] [place]. Time [name] Time) Disposition Final User 11/11/2021 9:01:30 AM Call EMS 911 Now Yes Durene Cal, RN, [name] Final Disposition 11/11/2021 9:01:30 AM Call EMS 911 Now Yes Durene Cal, RN, Berdie Ogren Disagree/Comply Disagree Caller Understands Yes PreDisposition InappropriateToAsk Care Advice Given Per Guideline CALL EMS 911 NOW: * Immediate medical attention is needed. You need to hang up and call 911 (or an ambulance). NOTE TO TRIAGER - IF CALLER ASKS ABOUT ASPIRIN: * Call EMS 911 first. Referrals [place]

## 2021-11-11 NOTE — ED Triage Notes (Signed)
Pt here with chest discomfort since Wed. Pt states the pain is intermittent until this morning when it became constant. Pt states pain is centered but does not radiate. Pt states she was nauseous on Wed night with discomfort and a headache.

## 2021-11-11 NOTE — Discharge Instructions (Addendum)
I am not finding anything on the EKG here the chest x-ray of the blood work.  I think everything will be good.  I do want you to follow-up with cardiology.  I have put a cardiology consult in.  They should call you within a couple days.  If they do not you can call Dr. Rockey Situ who is on-call for cardiology for [place] and ask his office if it would be possible for him to see you.  Let them know that you had a cardiology consult and with the head oncology for 2 days.  Please return for any shortness of breath or worse chest pain or any other symptoms.

## 2021-11-11 NOTE — Telephone Encounter (Signed)
Per chart review tab pt is presently at [facility] ED. Sending note to K Clark NP. 

## 2021-11-16 LAB — BLOOD GAS, VENOUS
Acid-Base Excess: 3.7 mmol/L — ABNORMAL HIGH (ref 0.0–2.0)
Bicarbonate: 27.7 mmol/L (ref 20.0–28.0)
O2 Saturation: 46.4 %
Patient temperature: 37
pCO2, Ven: 39 mmHg — ABNORMAL LOW (ref 44–60)
pH, Ven: 7.46 — ABNORMAL HIGH (ref 7.25–7.43)
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
## 2021-11-25 ENCOUNTER — Encounter: Payer: Self-pay | Admitting: Cardiology

## 2021-11-25 ENCOUNTER — Ambulatory Visit: Payer: 59 | Attending: Cardiology | Admitting: Cardiology

## 2021-11-25 VITALS — BP 118/74 | HR 83 | Ht 67.0 in | Wt 206.0 lb

## 2021-11-25 DIAGNOSIS — R0789 Other chest pain: Secondary | ICD-10-CM

## 2021-11-25 DIAGNOSIS — E7800 Pure hypercholesterolemia, unspecified: Secondary | ICD-10-CM | POA: Diagnosis not present

## 2021-11-25 NOTE — Patient Instructions (Signed)
Medication Instructions:  No change   Lab Work: None ordered   Testing/Procedures: Schedule Coronary Calcium Score   Follow-Up: At [facility], you and your health needs are our priority.  As part of our continuing mission to provide you with exceptional heart care, we have created designated Provider Care Teams.  These Care Teams include your primary Cardiologist (physician) and Advanced Practice Providers (APPs -  Physician Assistants and Nurse Practitioners) who all work together to provide you with the care you need, when you need it.  We recommend signing up for the patient portal called "MyChart".  Sign up information is provided on this After Visit Summary.  MyChart is used to connect with patients for Virtual Visits (Telemedicine).  Patients are able to view lab/test results, encounter notes, upcoming appointments, etc.  Non-urgent messages can be sent to your provider as well.   To learn more about what you can do with MyChart, go to [email or web address].    Your next appointment:  To Be Determined after test    The format for your next appointment: Office    Provider: Dr.Jordan   Important Information About Sugar

## 2022-01-11 ENCOUNTER — Encounter (HOSPITAL_BASED_OUTPATIENT_CLINIC_OR_DEPARTMENT_OTHER): Payer: Self-pay

## 2022-01-11 ENCOUNTER — Ambulatory Visit (HOSPITAL_BASED_OUTPATIENT_CLINIC_OR_DEPARTMENT_OTHER)
Admission: RE | Admit: 2022-01-11 | Discharge: 2022-01-11 | Disposition: A | Discharge status: Home / Self Care | Payer: 59 | Source: Ambulatory Visit | Attending: Cardiology | Admitting: Cardiology

## 2022-01-11 DIAGNOSIS — E7800 Pure hypercholesterolemia, unspecified: Secondary | ICD-10-CM | POA: Insufficient documentation

## 2022-01-11 DIAGNOSIS — R0789 Other chest pain: Secondary | ICD-10-CM | POA: Insufficient documentation

## 2022-06-26 ENCOUNTER — Telehealth: Payer: Self-pay

## 2022-06-26 NOTE — Telephone Encounter (Signed)
I spoke with pt and she did not go to UC. Pt said this morning pt does not have any symptoms; no vaginal discomfort and no burning upon urination pt has no fever, no abd and no back pain. Pt said has emergency dental appt today and pt will cb in early AM if syptpoms return. UC & ED precautions given and pt voiced understanding. Sending note to Allayne Gitelman NP and [facility] [facility].

## 2022-06-26 NOTE — Telephone Encounter (Signed)
Noted  

## 2022-06-30 ENCOUNTER — Encounter: Payer: Self-pay | Admitting: Primary Care

## 2022-06-30 ENCOUNTER — Ambulatory Visit: Payer: 59 | Admitting: Primary Care

## 2022-06-30 VITALS — BP 122/76 | HR 75 | Temp 98.1°F | Ht 67.0 in | Wt 193.0 lb

## 2022-06-30 DIAGNOSIS — R300 Dysuria: Secondary | ICD-10-CM

## 2022-06-30 LAB — URINALYSIS, MICROSCOPIC ONLY

## 2022-06-30 LAB — POC URINALSYSI DIPSTICK (AUTOMATED)
Bilirubin, UA: NEGATIVE
Blood, UA: POSITIVE
Glucose, UA: NEGATIVE
Ketones, UA: NEGATIVE
Leukocytes, UA: NEGATIVE
Nitrite, UA: NEGATIVE
Protein, UA: NEGATIVE
Spec Grav, UA: 1.015 (ref 1.010–1.025)
Urobilinogen, UA: 0.2 E.U./dL
pH, UA: 6 (ref 5.0–8.0)

## 2022-06-30 NOTE — Assessment & Plan Note (Signed)
UA today with trace blood. Culture sent. Add urine microscopic.   Fortunately, she's feeling better. Discussed to increase water intake.   Await culture results.

## 2022-06-30 NOTE — Patient Instructions (Signed)
We will be in touch regarding your urine test results in a few days.  Increase water intake.  It was a pleasure to see you today!
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
## 2022-07-02 ENCOUNTER — Other Ambulatory Visit: Payer: Self-pay | Admitting: Primary Care

## 2022-07-02 DIAGNOSIS — N3001 Acute cystitis with hematuria: Secondary | ICD-10-CM

## 2022-07-02 LAB — URINE CULTURE
MICRO NUMBER:: 15084558
SPECIMEN QUALITY:: ADEQUATE

## 2022-07-02 MED ORDER — SULFAMETHOXAZOLE-TRIMETHOPRIM 800-160 MG PO TABS: 1.0000 | ORAL_TABLET | Freq: Two times a day (BID) | ORAL | 0 refills | Status: DC

## 2022-07-13 ENCOUNTER — Telehealth: Payer: Self-pay | Admitting: Primary Care

## 2022-07-13 ENCOUNTER — Ambulatory Visit: Payer: 59 | Admitting: Primary Care

## 2022-07-13 VITALS — BP 120/78 | HR 101 | Temp 99.0°F | Ht 67.0 in | Wt 195.0 lb

## 2022-07-13 DIAGNOSIS — U071 COVID-19: Secondary | ICD-10-CM

## 2022-07-13 DIAGNOSIS — R051 Acute cough: Secondary | ICD-10-CM

## 2022-07-13 LAB — POC COVID19 BINAXNOW: SARS Coronavirus 2 Ag: POSITIVE — AB

## 2022-07-13 MED ORDER — NIRMATRELVIR/RITONAVIR (PAXLOVID)TABLET: 3.0000 | ORAL_TABLET | Freq: Two times a day (BID) | ORAL | 0 refills | Status: DC

## 2022-07-13 MED ORDER — NIRMATRELVIR/RITONAVIR (PAXLOVID)TABLET: 3.0000 | ORAL_TABLET | Freq: Two times a day (BID) | ORAL | 0 refills | Status: AC

## 2022-07-13 NOTE — Patient Instructions (Signed)
Start the Paxlovid medication for COVID-19 infection.  Take 3 capsules by mouth twice daily for 5 days.  Continue Tylenol for fevers/headaches.  Feel free to reach out with any questions!  It was a pleasure to see you today!

## 2022-07-13 NOTE — Assessment & Plan Note (Signed)
HPI and exam today consistent for viral etiology.  COVID-19 testing today positive. Discussed options for treatment which include antiviral prescription versus conservative care, she opts for antiviral treatment.  Reviewed labs from 2023. Prescription for Paxlovid sent to pharmacy.  Discussed instructions for use and potential side effects.  Continue Tylenol as needed. Offered work note for which she does not need.  Return precautions provided.

## 2022-07-13 NOTE — Addendum Note (Signed)
Addended by: Doreene Nest on: 07/13/2022 01:17 PM   Modules accepted: Orders

## 2022-07-13 NOTE — Telephone Encounter (Signed)
Pt called requesting that nirmatrelvir/ritonavir (PAXLOVID) 20 x 150 MG & 10 x 100MG  TABS [[id number]] be sent to [facility], [address] [place], [place], [place] [address]? Call back # [phone]
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
## 2022-07-13 NOTE — Telephone Encounter (Signed)
Noted, prescription sent to preferred pharmacy.

## 2022-09-05 LAB — HM DIABETES EYE EXAM

## 2022-10-06 LAB — HM MAMMOGRAPHY

## 2022-10-09 ENCOUNTER — Encounter: Payer: Self-pay | Admitting: Primary Care

## 2023-08-22 ENCOUNTER — Encounter (INDEPENDENT_AMBULATORY_CARE_PROVIDER_SITE_OTHER): Payer: Self-pay
